# Patient Record
Sex: Female | Born: 1955 | Race: White | Hispanic: No | Smoking: Former smoker
Health system: Southern US, Community
[De-identification: ages and names within clinical notes are randomized; demographics above are authoritative.]

## PROBLEM LIST (undated history)

## (undated) DIAGNOSIS — F329 Major depressive disorder, single episode, unspecified: Secondary | ICD-10-CM

## (undated) DIAGNOSIS — H02839 Dermatochalasis of unspecified eye, unspecified eyelid: Secondary | ICD-10-CM

## (undated) DIAGNOSIS — T4145XA Adverse effect of unspecified anesthetic, initial encounter: Secondary | ICD-10-CM

## (undated) DIAGNOSIS — M1990 Unspecified osteoarthritis, unspecified site: Secondary | ICD-10-CM

## (undated) DIAGNOSIS — T8859XA Other complications of anesthesia, initial encounter: Secondary | ICD-10-CM

## (undated) DIAGNOSIS — F32A Depression, unspecified: Secondary | ICD-10-CM

## (undated) DIAGNOSIS — M7661 Achilles tendinitis, right leg: Secondary | ICD-10-CM

## (undated) DIAGNOSIS — M7580 Other shoulder lesions, unspecified shoulder: Secondary | ICD-10-CM

## (undated) DIAGNOSIS — E785 Hyperlipidemia, unspecified: Secondary | ICD-10-CM

## (undated) DIAGNOSIS — R51 Headache: Secondary | ICD-10-CM

## (undated) DIAGNOSIS — G4730 Sleep apnea, unspecified: Secondary | ICD-10-CM

## (undated) DIAGNOSIS — E119 Type 2 diabetes mellitus without complications: Secondary | ICD-10-CM

## (undated) DIAGNOSIS — F419 Anxiety disorder, unspecified: Secondary | ICD-10-CM

## (undated) HISTORY — PX: OTHER SURGICAL HISTORY: SHX169

## (undated) HISTORY — PX: LUNG BIOPSY: SHX232

## (undated) HISTORY — PX: BREAST BIOPSY: SHX20

## (undated) HISTORY — PX: ABLATION: SHX5711

## (undated) HISTORY — PX: BILATERAL TEMPOROMANDIBULAR JOINT ARTHROPLASTY: SUR77

## (undated) HISTORY — PX: CHOLECYSTECTOMY: SHX55

## (undated) HISTORY — PX: DILATION AND CURETTAGE OF UTERUS: SHX78

## (undated) HISTORY — DX: Dermatochalasis of unspecified eye, unspecified eyelid: H02.839

## (undated) HISTORY — PX: CARPAL TUNNEL RELEASE: SHX101

---

## 1998-03-20 ENCOUNTER — Emergency Department (HOSPITAL_COMMUNITY): Admission: EM | Admit: 1998-03-20 | Discharge: 1998-03-20 | Payer: Self-pay | Admitting: Emergency Medicine

## 1999-04-19 ENCOUNTER — Encounter: Admission: RE | Admit: 1999-04-19 | Discharge: 1999-04-19 | Payer: Self-pay | Admitting: Family Medicine

## 1999-04-19 ENCOUNTER — Encounter: Payer: Self-pay | Admitting: Family Medicine

## 1999-05-04 ENCOUNTER — Other Ambulatory Visit: Admission: RE | Admit: 1999-05-04 | Discharge: 1999-05-04 | Payer: Self-pay | Admitting: *Deleted

## 1999-05-11 ENCOUNTER — Ambulatory Visit (HOSPITAL_COMMUNITY): Admission: RE | Admit: 1999-05-11 | Discharge: 1999-05-11 | Payer: Self-pay | Admitting: Orthopedic Surgery

## 1999-05-11 ENCOUNTER — Encounter (INDEPENDENT_AMBULATORY_CARE_PROVIDER_SITE_OTHER): Payer: Self-pay

## 1999-11-11 ENCOUNTER — Emergency Department (HOSPITAL_COMMUNITY): Admission: EM | Admit: 1999-11-11 | Discharge: 1999-11-11 | Payer: Self-pay | Admitting: Emergency Medicine

## 2000-04-07 ENCOUNTER — Encounter: Admission: RE | Admit: 2000-04-07 | Discharge: 2000-04-07 | Payer: Self-pay | Admitting: Family Medicine

## 2000-04-07 ENCOUNTER — Encounter: Payer: Self-pay | Admitting: Family Medicine

## 2000-05-03 ENCOUNTER — Other Ambulatory Visit: Admission: RE | Admit: 2000-05-03 | Discharge: 2000-05-03 | Payer: Self-pay | Admitting: *Deleted

## 2001-04-09 ENCOUNTER — Encounter: Admission: RE | Admit: 2001-04-09 | Discharge: 2001-04-09 | Payer: Self-pay | Admitting: Family Medicine

## 2001-04-09 ENCOUNTER — Encounter: Payer: Self-pay | Admitting: Family Medicine

## 2001-04-20 ENCOUNTER — Other Ambulatory Visit: Admission: RE | Admit: 2001-04-20 | Discharge: 2001-04-20 | Payer: Self-pay | Admitting: *Deleted

## 2001-04-24 ENCOUNTER — Encounter: Admission: RE | Admit: 2001-04-24 | Discharge: 2001-04-24 | Payer: Self-pay | Admitting: Family Medicine

## 2001-04-24 ENCOUNTER — Encounter: Payer: Self-pay | Admitting: Family Medicine

## 2001-06-12 ENCOUNTER — Ambulatory Visit (HOSPITAL_COMMUNITY): Admission: RE | Admit: 2001-06-12 | Discharge: 2001-06-12 | Payer: Self-pay | Admitting: Orthopedic Surgery

## 2002-01-31 ENCOUNTER — Encounter: Payer: Self-pay | Admitting: Emergency Medicine

## 2002-01-31 ENCOUNTER — Observation Stay (HOSPITAL_COMMUNITY): Admission: EM | Admit: 2002-01-31 | Discharge: 2002-02-01 | Payer: Self-pay | Admitting: Emergency Medicine

## 2002-01-31 ENCOUNTER — Encounter: Payer: Self-pay | Admitting: Family Medicine

## 2002-04-10 ENCOUNTER — Encounter: Admission: RE | Admit: 2002-04-10 | Discharge: 2002-04-10 | Payer: Self-pay | Admitting: Family Medicine

## 2002-04-10 ENCOUNTER — Encounter: Payer: Self-pay | Admitting: Family Medicine

## 2002-04-22 ENCOUNTER — Ambulatory Visit (HOSPITAL_COMMUNITY): Admission: RE | Admit: 2002-04-22 | Discharge: 2002-04-22 | Payer: Self-pay | Admitting: Internal Medicine

## 2002-07-31 ENCOUNTER — Encounter: Admission: RE | Admit: 2002-07-31 | Discharge: 2002-07-31 | Payer: Self-pay | Admitting: Orthopedic Surgery

## 2002-07-31 ENCOUNTER — Encounter: Payer: Self-pay | Admitting: Orthopedic Surgery

## 2002-08-13 ENCOUNTER — Encounter: Payer: Self-pay | Admitting: Orthopedic Surgery

## 2002-08-13 ENCOUNTER — Encounter: Admission: RE | Admit: 2002-08-13 | Discharge: 2002-08-13 | Payer: Self-pay | Admitting: Orthopedic Surgery

## 2002-08-26 ENCOUNTER — Encounter: Admission: RE | Admit: 2002-08-26 | Discharge: 2002-08-26 | Payer: Self-pay | Admitting: Orthopedic Surgery

## 2002-08-26 ENCOUNTER — Encounter: Payer: Self-pay | Admitting: Orthopedic Surgery

## 2002-09-16 ENCOUNTER — Encounter: Payer: Self-pay | Admitting: *Deleted

## 2002-09-16 ENCOUNTER — Encounter: Admission: RE | Admit: 2002-09-16 | Discharge: 2002-09-16 | Payer: Self-pay | Admitting: *Deleted

## 2002-09-23 ENCOUNTER — Encounter: Payer: Self-pay | Admitting: *Deleted

## 2002-09-23 ENCOUNTER — Ambulatory Visit (HOSPITAL_COMMUNITY): Admission: RE | Admit: 2002-09-23 | Discharge: 2002-09-23 | Payer: Self-pay | Admitting: *Deleted

## 2003-01-16 ENCOUNTER — Ambulatory Visit (HOSPITAL_BASED_OUTPATIENT_CLINIC_OR_DEPARTMENT_OTHER): Admission: RE | Admit: 2003-01-16 | Discharge: 2003-01-16 | Payer: Self-pay | Admitting: Gynecology

## 2003-04-14 ENCOUNTER — Encounter: Payer: Self-pay | Admitting: Gynecology

## 2003-04-14 ENCOUNTER — Other Ambulatory Visit: Admission: RE | Admit: 2003-04-14 | Discharge: 2003-04-14 | Payer: Self-pay | Admitting: Gynecology

## 2003-04-14 ENCOUNTER — Encounter: Admission: RE | Admit: 2003-04-14 | Discharge: 2003-04-14 | Payer: Self-pay | Admitting: Gynecology

## 2003-06-17 ENCOUNTER — Encounter: Admission: RE | Admit: 2003-06-17 | Discharge: 2003-09-15 | Payer: Self-pay | Admitting: Family Medicine

## 2004-04-14 ENCOUNTER — Encounter: Admission: RE | Admit: 2004-04-14 | Discharge: 2004-04-14 | Payer: Self-pay | Admitting: Family Medicine

## 2004-05-06 ENCOUNTER — Other Ambulatory Visit: Admission: RE | Admit: 2004-05-06 | Discharge: 2004-05-06 | Payer: Self-pay | Admitting: *Deleted

## 2004-09-10 ENCOUNTER — Ambulatory Visit (HOSPITAL_COMMUNITY): Admission: RE | Admit: 2004-09-10 | Discharge: 2004-09-10 | Payer: Self-pay | Admitting: Orthopedic Surgery

## 2005-04-15 ENCOUNTER — Encounter: Admission: RE | Admit: 2005-04-15 | Discharge: 2005-04-15 | Payer: Self-pay | Admitting: Family Medicine

## 2005-05-10 ENCOUNTER — Other Ambulatory Visit: Admission: RE | Admit: 2005-05-10 | Discharge: 2005-05-10 | Payer: Self-pay | Admitting: *Deleted

## 2006-04-17 ENCOUNTER — Encounter: Admission: RE | Admit: 2006-04-17 | Discharge: 2006-04-17 | Payer: Self-pay | Admitting: Family Medicine

## 2006-05-16 ENCOUNTER — Other Ambulatory Visit: Admission: RE | Admit: 2006-05-16 | Discharge: 2006-05-16 | Payer: Self-pay | Admitting: *Deleted

## 2007-02-15 ENCOUNTER — Ambulatory Visit (HOSPITAL_COMMUNITY): Admission: RE | Admit: 2007-02-15 | Discharge: 2007-02-15 | Payer: Self-pay | Admitting: Orthopedic Surgery

## 2007-04-19 ENCOUNTER — Encounter: Admission: RE | Admit: 2007-04-19 | Discharge: 2007-04-19 | Payer: Self-pay | Admitting: Family Medicine

## 2007-05-06 ENCOUNTER — Emergency Department (HOSPITAL_COMMUNITY): Admission: EM | Admit: 2007-05-06 | Discharge: 2007-05-06 | Payer: Self-pay | Admitting: Emergency Medicine

## 2007-05-22 ENCOUNTER — Other Ambulatory Visit: Admission: RE | Admit: 2007-05-22 | Discharge: 2007-05-22 | Payer: Self-pay | Admitting: Family Medicine

## 2007-10-15 ENCOUNTER — Emergency Department (HOSPITAL_COMMUNITY): Admission: EM | Admit: 2007-10-15 | Discharge: 2007-10-15 | Payer: Self-pay | Admitting: Emergency Medicine

## 2008-04-21 ENCOUNTER — Encounter: Admission: RE | Admit: 2008-04-21 | Discharge: 2008-04-21 | Payer: Self-pay | Admitting: Family Medicine

## 2008-05-22 ENCOUNTER — Other Ambulatory Visit: Admission: RE | Admit: 2008-05-22 | Discharge: 2008-05-22 | Payer: Self-pay | Admitting: Family Medicine

## 2008-06-10 ENCOUNTER — Encounter (INDEPENDENT_AMBULATORY_CARE_PROVIDER_SITE_OTHER): Payer: Self-pay | Admitting: Gastroenterology

## 2008-06-10 ENCOUNTER — Ambulatory Visit (HOSPITAL_COMMUNITY): Admission: RE | Admit: 2008-06-10 | Discharge: 2008-06-10 | Payer: Self-pay | Admitting: Gastroenterology

## 2009-02-26 ENCOUNTER — Ambulatory Visit (HOSPITAL_COMMUNITY): Admission: RE | Admit: 2009-02-26 | Discharge: 2009-02-26 | Payer: Self-pay | Admitting: Family Medicine

## 2009-04-22 ENCOUNTER — Encounter: Admission: RE | Admit: 2009-04-22 | Discharge: 2009-04-22 | Payer: Self-pay | Admitting: Family Medicine

## 2009-05-25 ENCOUNTER — Other Ambulatory Visit: Admission: RE | Admit: 2009-05-25 | Discharge: 2009-05-25 | Payer: Self-pay | Admitting: Family Medicine

## 2009-11-24 ENCOUNTER — Emergency Department (HOSPITAL_COMMUNITY): Admission: EM | Admit: 2009-11-24 | Discharge: 2009-11-24 | Payer: Self-pay | Admitting: Family Medicine

## 2010-03-08 ENCOUNTER — Encounter
Admission: RE | Admit: 2010-03-08 | Discharge: 2010-06-06 | Payer: Self-pay | Source: Home / Self Care | Attending: Family Medicine | Admitting: Family Medicine

## 2010-03-11 ENCOUNTER — Emergency Department (HOSPITAL_COMMUNITY): Admission: EM | Admit: 2010-03-11 | Discharge: 2010-03-11 | Payer: Self-pay | Admitting: Emergency Medicine

## 2010-03-14 ENCOUNTER — Emergency Department (HOSPITAL_COMMUNITY): Admission: EM | Admit: 2010-03-14 | Discharge: 2010-03-14 | Payer: Self-pay | Admitting: Family Medicine

## 2010-04-23 ENCOUNTER — Encounter: Admission: RE | Admit: 2010-04-23 | Discharge: 2010-04-23 | Payer: Self-pay | Admitting: Family Medicine

## 2010-06-08 ENCOUNTER — Other Ambulatory Visit
Admission: RE | Admit: 2010-06-08 | Discharge: 2010-06-08 | Payer: Self-pay | Source: Home / Self Care | Admitting: Family Medicine

## 2010-07-12 ENCOUNTER — Encounter: Payer: Self-pay | Admitting: Family Medicine

## 2010-09-02 LAB — URINALYSIS, ROUTINE W REFLEX MICROSCOPIC
Bilirubin Urine: NEGATIVE
Glucose, UA: NEGATIVE mg/dL
Hgb urine dipstick: NEGATIVE
Ketones, ur: NEGATIVE mg/dL
Nitrite: NEGATIVE
Protein, ur: NEGATIVE mg/dL
Specific Gravity, Urine: 1.014 (ref 1.005–1.030)
Urobilinogen, UA: 0.2 mg/dL (ref 0.0–1.0)
pH: 8.5 — ABNORMAL HIGH (ref 5.0–8.0)

## 2010-09-02 LAB — BASIC METABOLIC PANEL
BUN: 13 mg/dL (ref 6–23)
CO2: 23 mEq/L (ref 19–32)
Calcium: 10.3 mg/dL (ref 8.4–10.5)
Chloride: 104 mEq/L (ref 96–112)
Creatinine, Ser: 0.81 mg/dL (ref 0.4–1.2)
GFR calc Af Amer: >60 mL/min (ref >60)
GFR calc non Af Amer: >60 mL/min (ref >60)
Glucose, Bld: 101 mg/dL — ABNORMAL HIGH (ref 70–99)
Potassium: 4 mEq/L (ref 3.5–5.1)
Sodium: 139 mEq/L (ref 135–145)

## 2010-09-02 LAB — POCT I-STAT, CHEM 8
BUN: 10 mg/dL (ref 6–23)
Calcium, Ion: 1.15 mmol/L (ref 1.12–1.32)
Chloride: 103 mEq/L (ref 96–112)
Creatinine, Ser: 0.8 mg/dL (ref 0.4–1.2)
Glucose, Bld: 91 mg/dL (ref 70–99)
HCT: 44 % (ref 36.0–46.0)
Hemoglobin: 15 g/dL (ref 12.0–15.0)
Potassium: 3.8 mEq/L (ref 3.5–5.1)
Sodium: 141 mEq/L (ref 135–145)
TCO2: 26 mmol/L (ref 0–100)

## 2010-09-02 LAB — POCT PREGNANCY, URINE: Preg Test, Ur: NEGATIVE

## 2010-09-02 LAB — GLUCOSE, CAPILLARY: Glucose-Capillary: 106 mg/dL — ABNORMAL HIGH (ref 70–99)

## 2010-11-02 NOTE — Op Note (Signed)
Taylor Barnes, SANBORN              ACCOUNT NO.:  [id number]   MEDICAL RECORD NO.:  [id number]          PATIENT TYPE:  AMB   LOCATION:  ENDO                         FACILITY:  [facility]   PHYSICIAN:  Bernette Redbird, M.D.   DATE OF BIRTH:  06-29-1955   DATE OF PROCEDURE:  06/10/2008  DATE OF DISCHARGE:                               OPERATIVE REPORT   PROCEDURE:  Colonoscopy with biopsy.   INDICATIONS:  A 55 year old female for initial colon cancer screening  exam.  She has a family history of colon polyps in her sister at age 27  whose polyps do need endoscopic followup and are therefore presumed to  be adenomatous in Editor, commissioning.   FINDINGS:  Diminutive polyp in the mid colon removed by cold biopsy.   PROCEDURE:  The purpose and risks of the procedure had been reviewed  with the patient who provided written consent.  Sedation was Phenergan  25 mg, fentanyl 75 mcg, and Versed 7.5 mg IV without arrhythmias or  desaturation.  The Pentax adult video colonoscope was advanced around  the colon to the terminal ileum which had a normal appearance and  pullback was then performed.  The appendiceal orifice was readily  identified.   The only abnormality on this exam was a 2-3 mm sessile polyp in the  transverse colon removed by 3 cold biopsies.  No other polyps were seen,  and there was no evidence of cancer, colitis, vascular ectasia, or  diverticulosis.  Retroflexion in the rectum and re-inspection of the  rectum were unremarkable.  The patient tolerated the procedure well, and  there were no apparent complications.   IMPRESSION:  Solitary diminutive polyp.  Family history of colon polyps.   PLAN:  Await pathology results with consideration for repeat colonoscopy  in 5 years regardless of the histologic findings on the current polyp,  in view of the family history.           ______________________________  Bernette Redbird, M.D.     RB/MEDQ  D:  06/10/2008  T:  06/10/2008  Job:   [id number]   cc:   Caryn Bee L. Little, M.D.

## 2010-11-05 NOTE — H&P (Signed)
NAME:  Taylor Barnes, Taylor Barnes                        ACCOUNT NO.:  [id number]   MEDICAL RECORD NO.:  [id number]                   PATIENT TYPE:  AMB   LOCATION:  [facility]                                 [facility]ACILITY:  [facility]   PHYSICIAN:  Timothy P. Fontaine, M.D.           DATE OF BIRTH:  [birth year]   DATE OF ADMISSION:  DATE OF DISCHARGE:                                HISTORY & PHYSICAL   CHIEF COMPLAINT:  Menorrhagia.   HISTORY OF PRESENT ILLNESS:  The patient is a 55 year old G2, P2 female,  status post tubal sterilization, who presents with worsening menorrhagia.  Outpatient evaluation shows ultrasound with some small leiomyomata and a  sonohistogram showing an empty normal-appearing endometrial cavity.  Endometrial biopsies showed proliferative endometrium.  TSH and prolactin  were normal.  I reviewed the various options with her as far as control of  her menorrhagia, to include hormonal manipulation, up to and including  hysterectomy.  The patient is not interested in hormonal manipulation,  apparently she has tried oral contraceptives in the past, and did not  tolerate the side effect profile.  The patient does want to proceed with HTA  endometrial ablation.   PAST MEDICAL HISTORY:  Hypercholesterolemia.   PAST SURGICAL HISTORY:  1. Cesarean section x2 with tubal sterilization.  2. Carpal tunnel.  3. Breast biopsy.  4. Jaw surgery.   ALLERGIES:  No known drug allergies.   CURRENT MEDICATIONS:  Paxil 20 mg daily.   REVIEW OF SYSTEMS:  Noncontributory.   SOCIAL HISTORY:  Noncontributory.   FAMILY HISTORY:  Noncontributory.   ADMISSION PHYSICAL EXAMINATION:  GENERAL:  Per anesthesia.  PELVIC:  External BUS and vagina normal.  Cervix grossly normal.  Uterus  grossly normal in size, limited exam due to abdominal girth.  Adnexa without  gross masses or tenderness, again limited by abdominal girth.   ASSESSMENT:  A 55 year old G2, P2 female, status post tubal sterilization,  history of menorrhagia.  Ultrasound shows small fibroids.  Ultrasound does  not show any submucous component.  She had an endometrial biopsy which shows  proliferative endometrium, normal prolactin and TSH.  She is admitted for  HTA ablation.  I have reviewed with the patient what is involved with HTA  ablation to include the expected intraoperative and postoperative courses.  She understands that pregnancy should never be considered following this  procedure, and that she more then likely would never be able to become  pregnant, but more importantly, would have a significant risk if indeed she  chose a pregnancy.  She does have a tubal sterilization, and pregnancy is  not an issue.  I also reviewed with her that there is no guarantee as far as  menorrhagia relief.  I reviewed the success rates with HTA with her, but she  understands that she may or may not receive adequate relief from the HTA.  I  discussed instrumentation with her, and the risks to include  the risk of  bleeding, transfusion, infection, uterine perforation, damage to internal  organs, including bowel, bladder, ureters, vessels, and nerves necessitating  major exploratory reparative surgeries and future reparative surgeries,  including ostomy port formation.  She understands that we are using hot  fluids and that there is a risk of thermal injury, both internal as well as  vaginal and vulvar perineal burns.  She understands and accepts these risks,  and wants to proceed with surgery.  The patient's questions are answered to  her satisfaction.  She is ready to proceed.                                               Timothy P. Audie Box, M.D.    TPF/MEDQ  D:  01/14/2003  T:  01/14/2003  Job:  [id number]

## 2010-11-05 NOTE — Consult Note (Signed)
NAME:  Taylor Barnes, Taylor Barnes                        ACCOUNT NO.:  [id number]   MEDICAL RECORD NO.:  [id number]                   PATIENT TYPE:  OBV   LOCATION:  0350                                 FACILITY:  [facility]   PHYSICIAN:  Charlaine Dalton. Sherene Sires, M.D. [facility]           DATE OF BIRTH:  1955-09-06   DATE OF CONSULTATION:  DATE OF DISCHARGE:  02/01/2002                          PULMONARY MEDICINE CONSULTATION   REASON FOR CONSULTATION:  Chest pain/abnormal chest x-ray.   HISTORY:  This is a 55 year old white female former smoker admitted with  left-sided chest pain.  She has actually had several episodes of chest pain  before typically in the sitting or standing positions but this was the  worst ever.  It started yesterday while sitting at a meeting and with a  pressure sensation that gradually worsened and was associated with mild  diaphoresis and nausea as well as feeling dizzy headed.  She did not  respond to nitroglycerin and has been ruled out by Dr. Nehemiah Settle.  I was asked  to see her because the chest x-ray was markedly abnormal as was CT scan  showing a large right paratracheal calcified mass and multiple mediastinal  hilar nodes.   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  She,  however, was never treated for histoplasmosis.  She denies any difficulty  swallowing, significant wheezing, chronic coughing, fever, chills or  unintended weight loss.   The patient denies any pleuritic component to her pain.  She denies any  history of exertional chest pain.  She does have history of irritable bowel  syndrome but is not taking anything for this.   PAST MEDICAL HISTORY:  1. Obesity.  2. Cholecystectomy.  3. Carpal tunnel surgery.  4. Cesarean section surgery.  5. Temporomandibular joint.   ALLERGIES:  None  known.   SOCIAL HISTORY:  She is a remote smoker.  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  She denies any unusual travel or hobby exposure.   FAMILY HISTORY:  Questionably positive for angina in her father.  Positive  for cancer in her mother which apparently metastasized from breast to bone.  She has several other members of her mother's family that have had cancers  but no lung cancer lymphoma to her knowledge.   REVIEW OF SYSTEMS:  Essentially negative except as noted above.   PHYSICAL EXAMINATION:  GENERAL:  This is an obese pleasant white female in  no acute distress.  She has normal vital signs.  HEENT:  Unremarkable.  Neck supple without cervical adenopathy, tenderness,  __________ or megaly.  LUNGS:  Lung fields are perfectly clear bilaterally to auscultation and  percussion.  Excellent air movement.  No wheezing.  HEART:  Regular rate  and rhythm without murmur, rub or gallop.  ABDOMEN:  Obese.  Otherwise benign.  EXTREMITIES:  Warm.  No cyanosis, clubbing or edema.   LABORATORY DATA:  Essentially normal.  CT scan was available but no  comparison films were available indicating a large calcified lymph node in  the paratracheal distribution as well as nonspecific adenopathy elsewhere.   Office records reviewed and indicated that I saw this patient actually in  1999 for a cough and documented the above history regarding the  histoplasmosis but did not obtain any records at that time.   IMPRESSION:  1. Recurrent pattern of fairly localized pain, which is in the left anterior     chest over the heart but most likely represents referred pain from     irritable bowel syndrome.  The fact is the pain has recurred     stereotypically without a pattern to suggest angina and has never     occurred in a supine position nor been reproduced by exertion.  I am     going to defer to Dr. Nehemiah Settle for the work-up of this but I do not believe     it is a pulmonary problem  and think this is most likely irritable bowel     syndrome.  I would recommend a trial of Citrucel 1 tsp. b.i.d. with     Levsin/SL p.r.n.  2. 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  The main risk     here is one of worsening adenitis with fibrosing mediastinitis causing     obstruction of either major airways, arteries or esophagus.  I do not     believe this is the case here but I would like to see this patient back     in two weeks so that we can do an apples to apples in comparison with     previous x-rays at that time.   FOLLOW UP:  I do not see any reason to continue to evaluate her any further  as an inpatient unless Dr. Nehemiah Settle is concerned about her cardiac status.  I  discussed this with him and the patient in detail today.                                                Charlaine Dalton. Sherene Sires, M.D. [facility]    MBW/MEDQ  D:  02/01/2002  T:  02/03/2002  Job:  [id number]   cc:   Raynelle Dick, M.D.

## 2010-11-05 NOTE — H&P (Signed)
NAME:  Taylor Barnes, Taylor Barnes                        ACCOUNT NO.:  [id number]   MEDICAL RECORD NO.:  [id number]                   PATIENT TYPE:  OBV   LOCATION:  0350                                 FACILITY:  [facility]   PHYSICIAN:  Raynelle Jan, MD                  DATE OF BIRTH:  [birth year]   DATE OF ADMISSION:  01/31/2002  DATE OF DISCHARGE:  02/01/2002                                HISTORY & PHYSICAL   PRIMARY PHYSICIAN:  Caryn Bee L. Little, M.D.   CHIEF COMPLAINT:  Chest pain.   HISTORY OF PRESENT ILLNESS:  This is a 55 year old female with a past  medical history of hypercholesterolemia who presents to the ED today with  chest pain that is substernal in nature.  She describes the pain as a  pressure sensation, 7/10 at its worse.  It is now 5/10, without relief  status post nitroglycerin.  It is described as a pressure-type sensation  without radiation.  She denies any shortness of breath.  She does note some  occasional diaphoresis, some nausea, but no vomiting.  She has had some calf  pains for over a year now which she attributed to Lipitor use.  No recent  increase in her calf pain.  She has had no recent surgeries or  immobilizations.  She has had similar chest pain symptoms for approximately  a month now.  She denies any association with food or activity.  The pain  was worse today, and occurred at rest prompting her to seek medical  attention.  She states that over the last three days she has had some  worsening pedal edema, but again, denies shortness of breath, orthopnea, or  PND.  She currently complains of a severe migraine headache, status post  nitroglycerin.   PAST MEDICAL HISTORY:  1. Hypercholesterolemia.  2. Obesity.  3. Premenstrual dysphoric disorder.  4. Migraines.   PAST SURGICAL HISTORY:  1. Cesarean section x2.  2. TMJ surgery.  3. 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  4. Carpal tunnel release.  5. Status  post cholecystectomy.   CURRENT MEDICATIONS:  1. Lipitor 10 mg p.o. q.d.  2. Paxil 20 mg p.o. q.d.  3. Migraine pill p.r.n.   ALLERGIES:  She has an intolerance to ASPIRIN which causes nausea.   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  Is separated with two  children.  She is a former smoker, and quit 10 years ago.  Denies any  alcohol use.   FAMILY HISTORY:  Her mother is deceased and died of breast cancer at the age  of 36.   REVIEW OF SYMPTOMS:  She has a stable weight.  Denies any recent fevers or  chills.  Denies any recent cough or URI symptoms.  She had a recent increase  in her headache  frequency over the last month, however, these are her  typical migraines, have not changed in pattern or intensity, and are  associated with no neurological deficits.  She notes no recent GI symptoms  such as heartburn, indigestion, nausea, vomiting, diarrhea, or constipation.  As noted in the HPI, she has had some pedal edema over the last three days.  The remainder of her review of systems is negative x10.   PHYSICAL EXAMINATION:  VITAL SIGNS:  Temperature 98.0, blood pressure  146/62, pulse 83 and regular, respirations 24, she is saturating 98% on room  air.  GENERAL:  This is a pleasant female in no acute distress.  She is alert and  oriented x4.  HEENT:  No scleral icterus.  Her pupils are equal.  Mucus membranes are  moist and pink.  NECK:  Supple without JVD or lymphadenopathy or bruits.  CARDIOVASCULAR:  She has a normal S1 and S2, regular rate and rhythm, no  murmurs, rubs, or gallops.  LUNGS:  Clear to auscultation bilaterally.  ABDOMEN:  Soft, obese, nontender, nondistended, positive bowel sounds, no  hepatosplenomegaly or masses.  RECTAL:  Heme positive, however, the patient is currently on her menses.  EXTREMITIES:  Notable for trace pedal edema.  Pedal pulses are 1+  bilaterally.   LABORATORY DATA:  Her cardiac enzymes are negative.  She has a slightly   elevated lipase at 54.  The remainder of her labs are pending.  Her EKG  shows a normal sinus rhythm, no acute changes.  Her chest x-ray shows  cardiomegaly with some large calcified nodes in the right paratracheal area,  otherwise negative.   ASSESSMENT AND PLAN:  This is a 55 year old female with chest pain symptoms,  concerning for angina with cardiac risk factors of hypercholesterolemia,  previous tobacco use, and obesity.  1. Chest pain.  We will admit for observation and monitoring.  We will given     aspirin, nitroglycerin p.r.n., morphine for her pain.  We will go ahead     and start beta blocker.  We will rule out with serial cardiac enzymes.     We will check a D-dimer.  If equivocal or positive, we will proceed with     a CT of the chest to rule out  pulmonary embolism.  We will continue her     Lipitor, and we will check a fasting lipid profile in the a.m.  Based on     initial workup, we will consider cardiology consultation, either     inpatient versus outpatient risk stratification.  2. Migraine headaches.  For now, we will use narcotics for pain control for     any pain that is not relieved by Tylenol.  3. Premenstrual dysphoric disorder.  We will continue her on her SSRI.                                               Raynelle Jan, MD    HMS/MEDQ  D:  01/31/2002  T:  02/02/2002  Job:  [id number]   cc:   Caryn Bee L. Little, M.D.

## 2010-11-05 NOTE — Op Note (Signed)
[facility]  Patient:    Taylor Barnes, Taylor Barnes Visit Number: [id number] MRN: [id number]          Service Type: Attending:  Elisha Ponder, M.D. Dictated by:   Elisha Ponder, M.D. Proc. Date: 06/12/01                             Operative Report  DATE OF BIRTH:  Sep 21, 1955.  PREOPERATIVE DIAGNOSIS:  A1 pulley tenosynovitis about the right ring finger refractory to conservative management.  POSTOPERATIVE DIAGNOSIS:  A1 pulley tenosynovitis about the right finger refractory to conservative management.  PROCEDURE: 1. Right ring finger A1 pulley release. 2. Right ring finger local flexor digitorum superficialis tenosynovectomy.  SURGEON:  Aron Baba, M.D.  ASSISTANT:  None.  COMPLICATIONS:  None.  ANESTHESIA:  Bier block.  TOURNIQUET TIME:  Less than 30 minutes.  ESTIMATED BLOOD LOSS:  Minimal.  INDICATIONS:  This patient is a 55 year old white female who presents with the above-mentioned diagnosis. The patient was counseled in regards to risks and benefits of surgery, including risk of infection, bleeding, anesthesia, damage to normal structures, and failure of surgery to accomplish its intended goals of relieving symptoms and restoring function. With this in mind, she desired to proceed. All questions have been encouraged and answered preoperatively.  OPERATIVE FINDINGS:  This patient had a thickened A1 pulley and associated cystic mass off of the A1 pulley which was removed. The patient underwent four A1 pulley release and a local tenosynovectomy about the FDS tendon. She tolerated the procedure well without difficulty. There were no complications.  PROCEDURE IN DETAIL:  The patient was seen by myself and anesthesia, was taken to the operative suite and underwent Bier block anesthesia under the direction of Dr. Lyda Perone Tarasidis. She was then prepped and draped in the usual sterile fashion about the right upper extremity with Betadine  scrub and paint. Following this, a midline incision was made over the A1 pulley taking care to avoid encroachment onto the distal palmar crease. The dissection was carried through the skin with knife blade and cautery was used for hemostasis. Blunt dissection was carried down to the A1 pulley which was identified. There was a large cyst overlying the A1 pulley. This was removed with a knife blade. Following this under 4-point zoom lens magnification, A1 pulley was released proximally and distally. I should note that preoperatively the patient was locking and I demonstrated this. Postrelease of the A1 pulley, the patient had no locking. She tolerated the release well. Following this FDS tenosynovectomy was performed without difficulty locally. The tenosynovectomy was accomplished without difficulty. Following tenosynovectomy of inflammatory tissue, the patient then had tourniquet deflated. Hemostasis was obtained with bipolar electrocautery and copious irrigation applied followed by closure of the wound with interrupted Prolene. The patient tolerated the procedure well. Sterile dressing was placed without difficulty. All sponge and instrument counts were reported as correct and there were no immediate intraoperative complications.  The patient will be seen in seven days for followup. She was given Vicodin, Robaxin for discharge medicines.  It has been a pleasure to participate in her care. All questions have been encouraged and answered. She was stable, awake, alert, and oriented in the recovery room. Dictated by:   Elisha Ponder, M.D. Attending:  Elisha Ponder, M.D. DD:  06/12/01 TD:  06/13/01 Job: [id number]

## 2010-11-05 NOTE — Op Note (Signed)
NAME:  Taylor Barnes, Taylor Barnes                        ACCOUNT NO.:  [id number]   MEDICAL RECORD NO.:  [id number]                   PATIENT TYPE:  AMB   LOCATION:  [facility]                                 FACILITY:  [facility]   PHYSICIAN:  Timothy P. Fontaine, M.D.           DATE OF BIRTH:  07-04-1955   DATE OF PROCEDURE:  01/16/2003  DATE OF DISCHARGE:                                 OPERATIVE REPORT   PREOPERATIVE DIAGNOSIS:  Menorrhagia.   POSTOPERATIVE DIAGNOSIS:  Menorrhagia.   PROCEDURE:  HTA endometrial ablation.   SURGEON:  Timothy P. Fontaine, M.D.   ANESTHESIA:  General.   ESTIMATED BLOOD LOSS:  Minimal.   COMPLICATIONS:  None.   SPECIMENS:  None.   INDICATIONS FOR PROCEDURE:  A 55 year old G2, P2 female status post tubal  sterilization with menorrhagia for HTA ablation. Immediately preoperatively,  I again reviewed the risks of the procedure with her to include the long-  term issues as to no guarantee for menorrhagia relief, the risks of  hematometra as well as the long-term issues of scars, endometrial cancer and  evaluation is concerned. The patient again acknowledges the risks, accepts  them and is ready to proceed with surgery.   FINDINGS:  Hysteroscopic evaluation is adequate noting fundus, anterior,  posterior uterine surfaces, lower uterine segment, endocervical canal, right  and left tubal ostia all visualized. 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   DESCRIPTION OF PROCEDURE:  The patient was taken to the operating room,  underwent general anesthesia and was placed in the dorsal lithotomy  position, received a perineal vaginal preparation with Betadine solution.  Bladder emptied with in and out Foley catheterization and EUA was performed.  The patient was then draped in the usual fashion. The cervix was visualized  with a surgical speculum, anterior lip grasped with a single tooth  tenaculum. The uterus initially sounded  and then gradually dilated to a #23  dilator. The HTA hysteroscope was then placed without difficulty with  findings noted above. After assuring proper placement of the hysteroscope in  the lower uterine segment, the HTA placement was accomplished without  difficulty and without any fluid leakage. At the end of the procedure,  reinspection of the cavity revealed a small strip of endometrium at the  fundus was still pink and the hysteroscope was then advanced and the fundal  region was treated for a three minute cycle. The fundal area then  showed appropriate blanching. The fluid cleared and cooled. Reinspection  showed nice even treatment and subsequently at the end of the cycle, the  hysteroscope was removed, tenaculum removed, adequate hemostasis visualized.  The patient placed in supine position, awakened and taken to the recovery  room in good condition.  Timothy P. Audie Box, M.D.    TPF/MEDQ  D:  01/16/2003  T:  01/16/2003  Job:  [id number]

## 2010-11-05 NOTE — Op Note (Signed)
Taylor Barnes, Taylor Barnes              ACCOUNT NO.:  [id number]   MEDICAL RECORD NO.:  [id number]          PATIENT TYPE:  AMB   LOCATION:  DAY                          FACILITY:  [facility]   PHYSICIAN:  Dionne Ano. Gramig III, M.D.DATE OF BIRTH:  1955-11-01   DATE OF PROCEDURE:  DATE OF DISCHARGE:                                 OPERATIVE REPORT   PREOPERATIVE DIAGNOSES:  1.  Left ring finger, middle finger, A1 pulley tenosynovitis with very      conservative management.  2.  Left small finger early A1 pulley tenosynovitis.   POSTOPERATIVE DIAGNOSES:  1.  Left ring finger, middle finger, A1 pulley tenosynovitis with very      conservative management.  2.  Left small finger early A1 pulley tenosynovitis.   PROCEDURE:  1.  Left ring finger A1 pulley release.  2.  Left middle finger A1 pulley release.  3.  Left small finger injection with Kenalog and lidocaine.  4.  Left ring finger tenosynovectomy about the flexor digitorum profundus      and flexor digitorum superficialis tendons.   SURGEON:  Dionne Ano. Amanda Pea, M.D.   COMPLICATIONS:  None.   ANESTHESIA:  MAC with intermetacarpal block.   SPECIMENS:  None.   ESTIMATED BLOOD LOSS:  Minimal.   INDICATIONS FOR PROCEDURE:  Patient is a very pleasant 55 year old female  who presents __________ risks and benefits of surgery.  She has decided to  proceed with the above-mentioned operative intervention.   OPERATIVE FINDINGS:  Patient has thickened A1 pulleys about the ring and  middle fingers.  She underwent release without difficulty and local  tenosynovectomy about the ring finger.  I concomitantly performed an  injection over the small finger A1 pulley.   OPERATION IN DETAIL:  Patient was administered MAC anesthesia and taken to  the operative suite.  Underwent induction of MAC anesthesia.  Was given an  intermetacarpal block with Marcaine and lidocaine without epinephrine  mixture.  This was 0.25% Marcaine and 1% lidocaine without  epinephrine.  Once this was done, she was prepped and draped a second time with Betadine  scrub and paint.  __________ 250 mmHg.  Once this was done, a longitudinal  incision was made over the ring finger.  Dissection was carried down.  Neurovascular bundles were protected.  The A1 pulley was released under  __________ application without difficulty.  Following this, a local  tenosynovectomy was performed.  Traction check was applied.  All looked  quite well.  I was pleased with this.  The patient then underwent left  middle finger A1 pulley release.  There was no significant tenosynovitis or  excessive inflammation that had to be removed.  I performed traction check,  and all looked well.  Neurovascular structures were protected.  Following  this, the tourniquet was inflated.  Irrigation was obtained/performed.  The  patient tolerated this well.  Following this, the patient had an injection  of Kenalog and lidocaine mixture placed about the small finger A1 pulley  region.  This is done to my satisfaction without difficulty.  There were no  complicated features.  Once this was done, I then irrigated copiously  additionally and closed the wound with standard 2-0 Prolene.  Sterile  dressing was applied.  She had excellent refill in the soft compartments.  No complicating features.  She was taken to the recovery room in stable  condition.  All sponge, needle, and instrument counts were reported as  correct.  Patient will be monitored in the recovery room and discharged home  on Vicodin, Robaxin.  See [place] in one week.  Notify if significant problems  occur.      WMG/MEDQ  D:  09/10/2004  T:  09/10/2004  Job:  [id number]

## 2011-03-15 LAB — LIPASE, BLOOD: Lipase: 50

## 2011-03-15 LAB — HEPATIC FUNCTION PANEL
ALT: 25
AST: 27
Albumin: 3.4 — ABNORMAL LOW
Alkaline Phosphatase: 67
Bilirubin, Direct: 0.2
Indirect Bilirubin: 0.4
Total Bilirubin: 0.6
Total Protein: 6.6

## 2011-03-15 LAB — BASIC METABOLIC PANEL
BUN: 15
CO2: 21
Calcium: 9.1
Chloride: 105
Creatinine, Ser: 0.76
GFR calc Af Amer: >60
GFR calc non Af Amer: >60
Glucose, Bld: 110 — ABNORMAL HIGH
Potassium: 3.9
Sodium: 137

## 2011-03-15 LAB — CBC
HCT: 38.5
Hemoglobin: 13.5
MCHC: 35.1
MCV: 84.2
Platelets: 257
RBC: 4.57
RDW: 13.8
WBC: 9.7

## 2011-03-15 LAB — DIFFERENTIAL
Basophils Absolute: 0
Basophils Relative: 1
Eosinophils Absolute: 0.2
Eosinophils Relative: 2
Lymphocytes Relative: 19
Lymphs Abs: 1.9
Monocytes Absolute: 0.5
Monocytes Relative: 5
Neutro Abs: 7.1
Neutrophils Relative %: 73

## 2011-03-25 ENCOUNTER — Other Ambulatory Visit: Payer: Self-pay | Admitting: Family Medicine

## 2011-03-25 DIAGNOSIS — Z1231 Encounter for screening mammogram for malignant neoplasm of breast: Secondary | ICD-10-CM

## 2011-03-25 LAB — GLUCOSE, CAPILLARY: Glucose-Capillary: 112 mg/dL — ABNORMAL HIGH (ref 70–99)

## 2011-04-29 ENCOUNTER — Ambulatory Visit
Admission: RE | Admit: 2011-04-29 | Discharge: 2011-04-29 | Disposition: A | Discharge status: Home / Self Care | Payer: Commercial Managed Care - PPO | Source: Ambulatory Visit | Attending: Family Medicine | Admitting: Family Medicine

## 2011-04-29 DIAGNOSIS — Z1231 Encounter for screening mammogram for malignant neoplasm of breast: Secondary | ICD-10-CM

## 2011-06-22 ENCOUNTER — Other Ambulatory Visit: Payer: Self-pay | Admitting: Family Medicine

## 2011-06-22 ENCOUNTER — Other Ambulatory Visit (HOSPITAL_COMMUNITY)
Admission: RE | Admit: 2011-06-22 | Discharge: 2011-06-22 | Disposition: A | Discharge status: Home / Self Care | Payer: 59 | Source: Ambulatory Visit | Attending: Family Medicine | Admitting: Family Medicine

## 2011-06-22 DIAGNOSIS — Z124 Encounter for screening for malignant neoplasm of cervix: Secondary | ICD-10-CM | POA: Insufficient documentation

## 2011-06-22 DIAGNOSIS — Z1159 Encounter for screening for other viral diseases: Secondary | ICD-10-CM | POA: Insufficient documentation

## 2011-10-14 ENCOUNTER — Other Ambulatory Visit (HOSPITAL_COMMUNITY): Payer: Self-pay | Admitting: Family Medicine

## 2011-10-14 ENCOUNTER — Ambulatory Visit (HOSPITAL_COMMUNITY)
Admission: RE | Admit: 2011-10-14 | Discharge: 2011-10-14 | Disposition: A | Discharge status: Home / Self Care | Payer: 59 | Source: Ambulatory Visit | Attending: Family Medicine | Admitting: Family Medicine

## 2011-10-14 DIAGNOSIS — K7689 Other specified diseases of liver: Secondary | ICD-10-CM | POA: Insufficient documentation

## 2011-10-14 DIAGNOSIS — R7989 Other specified abnormal findings of blood chemistry: Secondary | ICD-10-CM

## 2011-10-14 DIAGNOSIS — R1013 Epigastric pain: Secondary | ICD-10-CM

## 2011-10-14 DIAGNOSIS — Z9089 Acquired absence of other organs: Secondary | ICD-10-CM | POA: Insufficient documentation

## 2011-10-14 DIAGNOSIS — D1803 Hemangioma of intra-abdominal structures: Secondary | ICD-10-CM | POA: Insufficient documentation

## 2011-10-14 DIAGNOSIS — D3000 Benign neoplasm of unspecified kidney: Secondary | ICD-10-CM | POA: Insufficient documentation

## 2011-10-14 DIAGNOSIS — K449 Diaphragmatic hernia without obstruction or gangrene: Secondary | ICD-10-CM | POA: Insufficient documentation

## 2011-10-14 LAB — POCT I-STAT, CHEM 8
BUN: 28 mg/dL — ABNORMAL HIGH (ref 6–23)
Calcium, Ion: 1.19 mmol/L (ref 1.12–1.32)
Chloride: 107 mEq/L (ref 96–112)
Creatinine, Ser: 0.8 mg/dL (ref 0.50–1.10)
Glucose, Bld: 96 mg/dL (ref 70–99)
HCT: 37 % (ref 36.0–46.0)
Hemoglobin: 12.6 g/dL (ref 12.0–15.0)
Potassium: 4.6 mEq/L (ref 3.5–5.1)
Sodium: 136 mEq/L (ref 135–145)
TCO2: 24 mmol/L (ref 0–100)

## 2011-10-14 MED ORDER — IOHEXOL 300 MG/ML  SOLN
100.0000 mL | Freq: Once | INTRAMUSCULAR | Status: AC | PRN
  Administered 2011-10-14: 100 mL via INTRAVENOUS

## 2011-11-21 ENCOUNTER — Other Ambulatory Visit (HOSPITAL_COMMUNITY): Payer: Self-pay | Admitting: Orthopedic Surgery

## 2011-11-21 DIAGNOSIS — M67919 Unspecified disorder of synovium and tendon, unspecified shoulder: Secondary | ICD-10-CM

## 2011-11-21 DIAGNOSIS — M719 Bursopathy, unspecified: Secondary | ICD-10-CM

## 2011-11-23 ENCOUNTER — Inpatient Hospital Stay (HOSPITAL_COMMUNITY): Admission: RE | Admit: 2011-11-23 | Payer: 59 | Source: Ambulatory Visit

## 2012-04-09 ENCOUNTER — Other Ambulatory Visit: Payer: Self-pay | Admitting: Family Medicine

## 2012-04-09 DIAGNOSIS — Z1231 Encounter for screening mammogram for malignant neoplasm of breast: Secondary | ICD-10-CM

## 2012-05-11 ENCOUNTER — Ambulatory Visit
Admission: RE | Admit: 2012-05-11 | Discharge: 2012-05-11 | Disposition: A | Discharge status: Home / Self Care | Payer: 59 | Source: Ambulatory Visit | Attending: Family Medicine | Admitting: Family Medicine

## 2012-05-11 DIAGNOSIS — Z1231 Encounter for screening mammogram for malignant neoplasm of breast: Secondary | ICD-10-CM

## 2012-06-06 ENCOUNTER — Ambulatory Visit (INDEPENDENT_AMBULATORY_CARE_PROVIDER_SITE_OTHER): Payer: 59 | Admitting: Family Medicine

## 2012-06-06 DIAGNOSIS — Z131 Encounter for screening for diabetes mellitus: Secondary | ICD-10-CM

## 2012-06-07 NOTE — Progress Notes (Signed)
Patient presents today for 3 month DM follow up as part of the employee sponsored Link to [employer]. Medications have been reviewed. I have also discussed with patient lifestyle interventions such as diet and exercise. Details of this visit can be found in [facility] documenting program through [facility]). Patient has set a series of personal goals and will follow up in 3 months for further review of DM.

## 2012-06-11 NOTE — Progress Notes (Signed)
Patient ID: Taylor Barnes, female   DOB: [birth year], 57 y.o.   MRN: [id number] Reviewed: Agree with documentation and management.

## 2012-08-04 ENCOUNTER — Other Ambulatory Visit (HOSPITAL_COMMUNITY): Payer: Self-pay | Admitting: Orthopedic Surgery

## 2012-08-04 DIAGNOSIS — M75100 Unspecified rotator cuff tear or rupture of unspecified shoulder, not specified as traumatic: Secondary | ICD-10-CM

## 2012-08-06 ENCOUNTER — Ambulatory Visit (HOSPITAL_COMMUNITY)
Admission: RE | Admit: 2012-08-06 | Discharge: 2012-08-06 | Disposition: A | Discharge status: Home / Self Care | Payer: 59 | Source: Ambulatory Visit | Attending: Orthopedic Surgery | Admitting: Orthopedic Surgery

## 2012-08-06 DIAGNOSIS — S43429A Sprain of unspecified rotator cuff capsule, initial encounter: Secondary | ICD-10-CM | POA: Insufficient documentation

## 2012-08-06 DIAGNOSIS — M25519 Pain in unspecified shoulder: Secondary | ICD-10-CM | POA: Insufficient documentation

## 2012-08-06 DIAGNOSIS — M75100 Unspecified rotator cuff tear or rupture of unspecified shoulder, not specified as traumatic: Secondary | ICD-10-CM

## 2012-08-06 DIAGNOSIS — X58XXXA Exposure to other specified factors, initial encounter: Secondary | ICD-10-CM | POA: Insufficient documentation

## 2012-09-04 ENCOUNTER — Ambulatory Visit (INDEPENDENT_AMBULATORY_CARE_PROVIDER_SITE_OTHER): Payer: Self-pay | Admitting: Family Medicine

## 2012-09-04 DIAGNOSIS — E119 Type 2 diabetes mellitus without complications: Secondary | ICD-10-CM

## 2012-09-04 NOTE — Progress Notes (Signed)
Patient presents to pharmacy for 3 month follow of DM as part of the employee sponsored Link to [employer]. Medications have been reviewed. I have also discussed with patient lifestyle interventions such as diet and exercise. Full documentation of this visit can be found in the [employer] documenting system through [employer] [facility]). Patient has set a series of personal goals and will return in 3 months for further review of DM.

## 2012-09-18 NOTE — Progress Notes (Signed)
Patient ID: Taylor Barnes, female   DOB: Aug 28, 1955, 57 y.o.   MRN: [id number] ATTENDING PHYSICIAN NOTE: I have reviewed the chart and agree with the plan as detailed above. Denny Levy MD Pager [phone]

## 2012-12-11 ENCOUNTER — Ambulatory Visit (INDEPENDENT_AMBULATORY_CARE_PROVIDER_SITE_OTHER): Payer: 59 | Admitting: Family Medicine

## 2012-12-11 DIAGNOSIS — E119 Type 2 diabetes mellitus without complications: Secondary | ICD-10-CM

## 2012-12-11 NOTE — Progress Notes (Signed)
Patient presents for 3 month follow DM as part of the employee sponsored Link to [employer]. Medications have been reviewed. I have also discussed with patient lifestyle interventions such as diet and exercise. Full documentation of this visit can be found in the [employer] documenting system through [employer] [facility]). Patient has set a series of personal goals and will follow up in 3 months for further review of DM.

## 2013-01-08 NOTE — Progress Notes (Signed)
Patient ID: Taylor Barnes, female   DOB: 11/28/1955, 57 y.o.   MRN: [id number] ATTENDING PHYSICIAN NOTE: I have reviewed the chart and agree with the plan as detailed above. Marletta Bousquet MD Pager [phone]  

## 2013-04-04 ENCOUNTER — Ambulatory Visit (INDEPENDENT_AMBULATORY_CARE_PROVIDER_SITE_OTHER): Payer: 59 | Admitting: Family Medicine

## 2013-04-04 VITALS — BP 114/72 | HR 83

## 2013-04-04 DIAGNOSIS — E119 Type 2 diabetes mellitus without complications: Secondary | ICD-10-CM

## 2013-04-04 NOTE — Progress Notes (Signed)
Patient presents for 3 mo f/u DM as part of the employee sponsored Link to [employer]. Medications have been reviewed. I have also discussed with patient lifestyle interventions such as diet and exercise. Full documentation of this visit can be found in the [employer] documenting system through [employer] [facility]). However specifics of this visit include the following:  Plan 1.) POC A1C 5.9, at goal on metformin, no recommended changes 2.) goal wt loss by next 5 lb 3.) log 50 workouts onto [email or web address] for $50 4.) Ask MD at next visit if she has received pneumovax before  Patient has set a series of personal goals and will f/u in 3 mo for further review of DM

## 2013-04-19 ENCOUNTER — Other Ambulatory Visit: Payer: Self-pay

## 2013-04-19 DIAGNOSIS — Z1231 Encounter for screening mammogram for malignant neoplasm of breast: Secondary | ICD-10-CM

## 2013-05-21 ENCOUNTER — Ambulatory Visit
Admission: RE | Admit: 2013-05-21 | Discharge: 2013-05-21 | Disposition: A | Discharge status: Home / Self Care | Payer: 59 | Source: Ambulatory Visit

## 2013-05-21 DIAGNOSIS — Z1231 Encounter for screening mammogram for malignant neoplasm of breast: Secondary | ICD-10-CM

## 2013-06-11 NOTE — Progress Notes (Signed)
Patient ID: Taylor Barnes, female   DOB: 05/16/1956, 57 y.o.   MRN: [id number] ATTENDING PHYSICIAN NOTE: I have reviewed the chart and agree with the plan as detailed above. Sara Neal MD Pager [phone]  

## 2013-07-15 ENCOUNTER — Ambulatory Visit (INDEPENDENT_AMBULATORY_CARE_PROVIDER_SITE_OTHER): Payer: Self-pay | Admitting: Family Medicine

## 2013-07-15 VITALS — BP 116/73 | HR 79 | Wt 229.0 lb

## 2013-07-15 DIAGNOSIS — E119 Type 2 diabetes mellitus without complications: Secondary | ICD-10-CM

## 2013-07-15 NOTE — Progress Notes (Signed)
Patient presents for 3 mo f/u DM as part of the employee sponsored Link to [employer]. Medications have been reviewed. I have also discussed with patient lifestyle interventions such as diet and exercise. Full documentation of this visit can be found in the [employer] documenting system through [facility]). However specifics from this visit include the following:  Diabetes Mellitus: POC A1C 5.5, at goal, no recommended changes. Patient will work on exercising more. f/u 3 mo  Hyperlipidemia: on appropriately dosed statin  Hypertension: at goal on lisinopril   Patient has set a series of personal goals and will f/u in 3 mo for further review of DM.

## 2013-08-09 NOTE — Progress Notes (Signed)
Patient ID: Taylor Barnes, female   DOB: [birth year], 58 y.o.   MRN: [id number] ATTENDING PHYSICIAN NOTE: I have reviewed the chart and agree with the plan as detailed above. Dorcas Mcmurray MD Pager [phone]

## 2013-10-17 ENCOUNTER — Ambulatory Visit (INDEPENDENT_AMBULATORY_CARE_PROVIDER_SITE_OTHER): Payer: Self-pay | Admitting: Family Medicine

## 2013-10-17 VITALS — BP 116/70 | HR 78 | Wt 226.0 lb

## 2013-10-17 DIAGNOSIS — E119 Type 2 diabetes mellitus without complications: Secondary | ICD-10-CM

## 2013-10-17 NOTE — Progress Notes (Signed)
Patient presents for 3 mo f/u DM as part of the employee sponsored [employer]. Medications have been reviewed. I have also discussed with patient lifestyle interventions such as diet and exercise. Full documentation of this visit can be found in the [employer]tracker documenting system through [facility]). However specifics from this visit include the following:  Diabetes Mellitus:POC A1C 5.8, at goal on metformin. No recommended changes. will move patient to 6 mo f/u as she has been stable for a long time. Discussed diet and exercise improvement. Patient going through very stressful time at work and recently lost her brother so diet has been worse.  Hypertension: at goal on lisinopril  Patient has set a series of personal goals and will f/u in 6 mo for further review of DM

## 2013-11-15 ENCOUNTER — Encounter (HOSPITAL_COMMUNITY): Payer: Self-pay | Admitting: Pharmacy Technician

## 2013-11-19 ENCOUNTER — Encounter (HOSPITAL_COMMUNITY): Payer: Self-pay | Admitting: Pharmacy Technician

## 2013-11-25 ENCOUNTER — Encounter (HOSPITAL_COMMUNITY): Payer: Self-pay | Admitting: *Deleted

## 2013-12-06 ENCOUNTER — Encounter (HOSPITAL_COMMUNITY)
Admission: RE | Disposition: A | Discharge status: Home / Self Care | Payer: Self-pay | Source: Ambulatory Visit | Attending: Gastroenterology

## 2013-12-06 ENCOUNTER — Ambulatory Visit (HOSPITAL_COMMUNITY): Payer: 59 | Admitting: Anesthesiology

## 2013-12-06 ENCOUNTER — Encounter (HOSPITAL_COMMUNITY): Payer: 59 | Admitting: Anesthesiology

## 2013-12-06 ENCOUNTER — Ambulatory Visit (HOSPITAL_COMMUNITY)
Admission: RE | Admit: 2013-12-06 | Discharge: 2013-12-06 | Disposition: A | Discharge status: Home / Self Care | Payer: 59 | Source: Ambulatory Visit | Attending: Gastroenterology | Admitting: Gastroenterology

## 2013-12-06 ENCOUNTER — Encounter (HOSPITAL_COMMUNITY): Payer: Self-pay | Admitting: Anesthesiology

## 2013-12-06 DIAGNOSIS — Z860100 Personal history of colon polyps, unspecified: Secondary | ICD-10-CM | POA: Insufficient documentation

## 2013-12-06 DIAGNOSIS — I10 Essential (primary) hypertension: Secondary | ICD-10-CM | POA: Insufficient documentation

## 2013-12-06 DIAGNOSIS — Z1211 Encounter for screening for malignant neoplasm of colon: Secondary | ICD-10-CM | POA: Insufficient documentation

## 2013-12-06 DIAGNOSIS — E119 Type 2 diabetes mellitus without complications: Secondary | ICD-10-CM | POA: Insufficient documentation

## 2013-12-06 DIAGNOSIS — Z87891 Personal history of nicotine dependence: Secondary | ICD-10-CM | POA: Insufficient documentation

## 2013-12-06 DIAGNOSIS — Z6839 Body mass index (BMI) 39.0-39.9, adult: Secondary | ICD-10-CM | POA: Insufficient documentation

## 2013-12-06 DIAGNOSIS — M19019 Primary osteoarthritis, unspecified shoulder: Secondary | ICD-10-CM | POA: Insufficient documentation

## 2013-12-06 DIAGNOSIS — Z79899 Other long term (current) drug therapy: Secondary | ICD-10-CM | POA: Insufficient documentation

## 2013-12-06 HISTORY — DX: Other complications of anesthesia, initial encounter: T88.59XA

## 2013-12-06 HISTORY — DX: Unspecified osteoarthritis, unspecified site: M19.90

## 2013-12-06 HISTORY — DX: Headache: R51

## 2013-12-06 HISTORY — DX: Type 2 diabetes mellitus without complications: E11.9

## 2013-12-06 HISTORY — PX: COLONOSCOPY: SHX5424

## 2013-12-06 HISTORY — DX: Adverse effect of unspecified anesthetic, initial encounter: T41.45XA

## 2013-12-06 HISTORY — DX: Other shoulder lesions, unspecified shoulder: M75.80

## 2013-12-06 LAB — GLUCOSE, CAPILLARY: Glucose-Capillary: 109 mg/dL — ABNORMAL HIGH (ref 70–99)

## 2013-12-06 SURGERY — COLONOSCOPY
Anesthesia: Monitor Anesthesia Care

## 2013-12-06 MED ORDER — SODIUM CHLORIDE 0.9 % IV SOLN: INTRAVENOUS | Status: DC

## 2013-12-06 MED ORDER — LACTATED RINGERS IV SOLN
INTRAVENOUS | Status: DC | PRN
  Administered 2013-12-06: 07:00:00 via INTRAVENOUS
  Administered 2013-12-06: 1000 mL

## 2013-12-06 MED ORDER — PROPOFOL 10 MG/ML IV BOLUS
INTRAVENOUS | Status: AC
  Filled 2013-12-06: qty 20

## 2013-12-06 MED ORDER — PROPOFOL 10 MG/ML IV BOLUS
INTRAVENOUS | Status: DC | PRN
  Administered 2013-12-06 (×2): 25 mg via INTRAVENOUS
  Administered 2013-12-06: 50 mg via INTRAVENOUS
  Administered 2013-12-06: 25 mg via INTRAVENOUS
  Administered 2013-12-06: 50 mg via INTRAVENOUS
  Administered 2013-12-06: 25 mg via INTRAVENOUS
  Administered 2013-12-06 (×3): 50 mg via INTRAVENOUS
  Administered 2013-12-06: 25 mg via INTRAVENOUS

## 2013-12-06 NOTE — Anesthesia Postprocedure Evaluation (Signed)
Anesthesia Post Note  Patient: Taylor Barnes  Procedure(s) Performed: Procedure(s) (LRB): COLONOSCOPY (N/A)  Anesthesia type: MAC  Patient location: PACU  Post pain: Pain level controlled  Post assessment: Post-op Vital signs reviewed  Last Vitals: BP 122/66  Pulse 53  Temp(Src) 36.9 C  Resp 19  Ht 5\' 4"  (1.626 m)  Wt 228 lb (103.42 kg)  BMI 39.12 kg/m2  SpO2 98%  Post vital signs: Reviewed  Level of consciousness: awake  Complications: No apparent anesthesia complications

## 2013-12-06 NOTE — Transfer of Care (Signed)
Immediate Anesthesia Transfer of Care Note  Patient: Taylor Barnes  Procedure(s) Performed: Procedure(s): COLONOSCOPY (N/A)  Patient Location: PACU  Anesthesia Type:MAC  Level of Consciousness: awake, sedated and patient cooperative  Airway & Oxygen Therapy: Patient Spontanous Breathing and Patient connected to face mask oxygen  Post-op Assessment: Report given to PACU RN and Post -op Vital signs reviewed and stable  Post vital signs: Reviewed and stable  Complications: No apparent anesthesia complications

## 2013-12-06 NOTE — H&P (Signed)
Taylor Barnes is an 58 y.o. female.   Chief Complaint: History of colon polyps HPI: Without current active GI problems or symptoms comes in to have surveillance colonoscopy. She had a solitary diminutive adenomatous polyp removed approximately 5-1/2 years ago.  Past Medical History  Diagnosis Date  . Headache(784.0)     hx of migraines  . Arthritis     both shoulders  . AC (acromioclavicular) joint bone spurs     both feet  . Complication of anesthesia     trouble waking up  . Diabetes mellitus without complication     takes lisinopril to protect kidneys    Past Surgical History  Procedure Laterality Date  . Lung biopsy  age 89  . Bilateral temporomandibular joint arthroplasty  age 36  . Breast biopsy Right yrs ago  . Carpal tunnel release Bilateral     multiple times both hands  .   c sections      x 2  . Cholecystectomy  yrs ago  . Dilation and curettage of uterus  yrs ago  . Cyst removed from hand  yrs ago  . Ablation      uteres    History reviewed. No pertinent family history. Social History:  reports that she quit smoking about 28 years ago. She has never used smokeless tobacco. She reports that she does not drink alcohol or use illicit drugs.  Allergies:  Allergies  Allergen Reactions  . Aspirin Nausea And Vomiting    Medications Prior to Admission  Medication Sig Dispense Refill  . atorvastatin (LIPITOR) 40 MG tablet Take 40 mg by mouth every morning.       Marland Kitchen lisinopril (PRINIVIL,ZESTRIL) 5 MG tablet Take 5 mg by mouth every morning.       . meloxicam (MOBIC) 15 MG tablet Take 15 mg by mouth daily.      . sertraline (ZOLOFT) 100 MG tablet Take 150 mg by mouth every morning. Take 1.5 tablets by mouth daily      . metFORMIN (GLUCOPHAGE) 500 MG tablet Take 500-1,000 mg by mouth 2 (two) times daily with a meal. 1 tablet in the morning and 2 in the afternoon        Results for orders placed during the hospital encounter of 12/06/13 (from the past 48 hour(s))   GLUCOSE, CAPILLARY     Status: Abnormal   Collection Time    12/06/13  7:26 AM      Result Value Ref Range   Glucose-Capillary 109 (*) 70 - 99 mg/dL   No results found.  ROS  No active GI complaints  Blood pressure 115/68, pulse 60, temperature 98.4 F (36.9 C), resp. rate 14, height 5\' 4"  (1.626 m), weight 103.42 kg (228 lb), SpO2 95.00%. Physical Exam a significantly overweight but otherwise healthy-appearing Caucasian female in no distress. Vital signs unremarkable. Chest clear. Heart is without murmurs, gallops, or arrhythmias. Abdomen significantly obese, but nontender. No evident pallor or icterus.  Assessment/Plan History of solitary diminutive adenomatous polyp. For surveillance. Plan is colonoscopy under propofol sedation.  Howard Bunte V 12/06/2013, 7:37 AM

## 2013-12-06 NOTE — Discharge Instructions (Signed)
We anticipate doing a repeat surveillance colonoscopy in 10 years.Colonoscopy, Care After Refer to this sheet in the next few weeks. These instructions provide you with information on caring for yourself after your procedure. Your health care provider may also give you more specific instructions. Your treatment has been planned according to current medical practices, but problems sometimes occur. Call your health care provider if you have any problems or questions after your procedure. WHAT TO EXPECT AFTER THE PROCEDURE  After your procedure, it is typical to have the following:  A small amount of blood in your stool.  Moderate amounts of gas and mild abdominal cramping or bloating. HOME CARE INSTRUCTIONS  Do not drive, operate machinery, or sign important documents for 24 hours.  You may shower and resume your regular physical activities, but move at a slower pace for the first 24 hours.  Take frequent rest periods for the first 24 hours.  Walk around or put a warm pack on your abdomen to help reduce abdominal cramping and bloating.  Drink enough fluids to keep your urine clear or pale yellow.  You may resume your normal diet as instructed by your health care provider. Avoid heavy or fried foods that are hard to digest.  Avoid drinking alcohol for 24 hours or as instructed by your health care provider.  Only take over-the-counter or prescription medicines as directed by your health care provider.  If a tissue sample (biopsy) was taken during your procedure:  Do not take aspirin or blood thinners for 7 days, or as instructed by your health care provider.  Do not drink alcohol for 7 days, or as instructed by your health care provider.  Eat soft foods for the first 24 hours. SEEK MEDICAL CARE IF: You have persistent spotting of blood in your stool 2-3 days after the procedure. SEEK IMMEDIATE MEDICAL CARE IF:  You have more than a small spotting of blood in your stool.  You pass  large blood clots in your stool.  Your abdomen is swollen (distended).  You have nausea or vomiting.  You have a fever.  You have increasing abdominal pain that is not relieved with medicine. Document Released: 01/19/2004 Document Revised: 03/27/2013 Document Reviewed: 02/11/2013 [facility] Patient Information 2015 [place], [place]. This information is not intended to replace advice given to you by your health care provider. Make sure you discuss any questions you have with your health care provider.

## 2013-12-06 NOTE — Anesthesia Preprocedure Evaluation (Signed)
Anesthesia Evaluation  Patient identified by MRN, date of birth, ID band Patient awake    Reviewed: Allergy & Precautions, H&P , NPO status , Patient's Chart, lab work & pertinent test results  History of Anesthesia Complications Negative for: history of anesthetic complications  Airway Mallampati: II TM Distance: >3 FB Neck ROM: Full    Dental  (+) Dental Advisory Given   Pulmonary former smoker,          Cardiovascular hypertension, Rhythm:Regular Rate:Normal     Neuro/Psych  Headaches, negative psych ROS   GI/Hepatic negative GI ROS, Neg liver ROS,   Endo/Other  diabetes, Type 2Morbid obesity  Renal/GU negative Renal ROS     Musculoskeletal negative musculoskeletal ROS (+)   Abdominal   Peds  Hematology negative hematology ROS (+)   Anesthesia Other Findings   Reproductive/Obstetrics negative OB ROS                           Anesthesia Physical Anesthesia Plan  ASA: III  Anesthesia Plan: MAC   Post-op Pain Management:    Induction: Intravenous  Airway Management Planned:   Additional Equipment:   Intra-op Plan:   Post-operative Plan:   Informed Consent: I have reviewed the patients History and Physical, chart, labs and discussed the procedure including the risks, benefits and alternatives for the proposed anesthesia with the patient or authorized representative who has indicated his/her understanding and acceptance.   Dental advisory given  Plan Discussed with: CRNA  Anesthesia Plan Comments:         Anesthesia Quick Evaluation

## 2013-12-06 NOTE — Op Note (Signed)
[facility] [place], [address]   COLONOSCOPY PROCEDURE REPORT  PATIENT: Taylor Barnes, Taylor Barnes  MR#: [id number] BIRTHDATE: [birth year] , 57  yrs. old GENDER: Female ENDOSCOPIST: Ronald Lobo, MD REFERRED BY:   Dr. Lennette Bihari little PROCEDURE DATE:  12/06/2013 PROCEDURE:     colonoscopy ASA CLASS: INDICATIONS:  followup of solitary diminutive adenoma 5 years ago MEDICATIONS:    MAC, per anesthesia  DESCRIPTION OF PROCEDURE: the patient came as an outpatient to the [facility] long endoscopy unit and provided written consent. Propofol sedation was administered and time out was performed.  The Pentax adult video colonoscope was advanced to the cecum with ease. The cecum was identified by visualization of the appendiceal orifice and ileocecal valve. CO2 insufflation was used for this exam. The quality of the prep was excellent it is felt that all areas were well seen.  This was a normal examination. No polyps, masses, diverticulosis, colitis, or vascular ectasia were noted.  Retroflexion in the rectum and reinspection of the rectosigmoid for unremarkable.  Particularly careful attention was paid to the proximal colon, with no lesions identified.     COMPLICATIONS: None  ENDOSCOPIC IMPRESSION:normal colonoscopy  RECOMMENDATIONS:repeat colonoscopy in 10 years, as per current guidelines for patients such as this with a history of a solitary diminutive adenoma and a  negative 5 year surveillance examination.    _______________________________ eSignedRonald Lobo, MD 12/06/2013 8:22 AM

## 2013-12-09 ENCOUNTER — Encounter (HOSPITAL_COMMUNITY): Payer: Self-pay | Admitting: Gastroenterology

## 2014-01-09 NOTE — Progress Notes (Signed)
Patient ID: Taylor Barnes, female   DOB: 1956-04-21, 58 y.o.   MRN: [id number] ATTENDING PHYSICIAN NOTE: I have reviewed the chart and agree with the plan as detailed above. Dorcas Mcmurray MD Pager [phone]

## 2014-04-02 ENCOUNTER — Ambulatory Visit (INDEPENDENT_AMBULATORY_CARE_PROVIDER_SITE_OTHER): Payer: Self-pay | Admitting: Family Medicine

## 2014-04-02 VITALS — BP 98/61 | HR 81 | Wt 229.0 lb

## 2014-04-02 DIAGNOSIS — E119 Type 2 diabetes mellitus without complications: Secondary | ICD-10-CM

## 2014-04-02 NOTE — Progress Notes (Signed)
Patient presents for 3 mo f/u DM as part of the employee sponsored Link to [employer]. Medications have been reviewed. I have also discussed with patient lifestyle interventions such as diet and exercise. Full documentation of this visit can be found in the [employer] documenting system through [facility]). However, specifics from this visit include the following:  Diabetes Mellitus: POC A1C 6, at goal on metformin. No recommended changes. Patient will track calories on live life well homepage. f/u 3 months  Hyperlipidemia: on appropriately dosed statin, no recommended changes.  Hypertension: BP running a little low. Patient states she does get dizzy sometimes. I told her to check it over the next 2 weeks and if still running low and/or dizzy to contact her physician for dose reduction of lisinopril.   Patient has set a series of personal goals and will f/u in 3 mo for further review of DM

## 2014-04-16 ENCOUNTER — Other Ambulatory Visit: Payer: Self-pay

## 2014-04-16 DIAGNOSIS — Z1239 Encounter for other screening for malignant neoplasm of breast: Secondary | ICD-10-CM

## 2014-05-06 NOTE — Progress Notes (Signed)
Patient ID: Taylor Barnes, female   DOB: Aug 23, 1955, 58 y.o.   MRN: [id number] Reviewed: Agree with the documentation and management of our [place].

## 2014-05-22 ENCOUNTER — Other Ambulatory Visit: Payer: Self-pay

## 2014-05-22 ENCOUNTER — Ambulatory Visit
Admission: RE | Admit: 2014-05-22 | Discharge: 2014-05-22 | Disposition: A | Discharge status: Home / Self Care | Payer: 59 | Source: Ambulatory Visit

## 2014-05-22 DIAGNOSIS — Z1231 Encounter for screening mammogram for malignant neoplasm of breast: Secondary | ICD-10-CM

## 2015-01-27 ENCOUNTER — Ambulatory Visit (INDEPENDENT_AMBULATORY_CARE_PROVIDER_SITE_OTHER): Payer: Self-pay | Admitting: Family Medicine

## 2015-01-27 ENCOUNTER — Ambulatory Visit: Payer: Self-pay

## 2015-01-27 VITALS — BP 129/75 | HR 90 | Wt 240.0 lb

## 2015-01-27 DIAGNOSIS — E119 Type 2 diabetes mellitus without complications: Secondary | ICD-10-CM

## 2015-01-27 LAB — POCT GLYCOSYLATED HEMOGLOBIN (HGB A1C): Hemoglobin A1C: 6.2

## 2015-01-27 NOTE — Patient Instructions (Signed)
Work on exercise. Goal 150 minutes per week.

## 2015-01-27 NOTE — Progress Notes (Signed)
Patient returns to pharmacy for 3 month f/u DM as part of the employee sponsored link to wellness program. Medications have been reviewed. I have also discussed with patient lifestyle interventions such as diet and exercise.  Diabetes- POC A1C 6.2, at goal although it has risen from the consistently being in the 5's. No recommended medications changes. Patient has gained weight but has moved in with her daughter and is now moving again so she has lost control of her diet in recent months. She will work on diet and exercise over the next few months to get her weight back down. She is going to start walking more.  Hypertension-blood pressure at goal  Lipids-on appropriately dosed statin  Patient has set a series of personal goals and will f/u in 3 months for further review of DM

## 2015-02-25 NOTE — Progress Notes (Signed)
ATTENDING PHYSICIAN NOTE: I have reviewed the chart and agree with the plan as detailed above. Neetu Carrozza MD Pager [phone]  

## 2015-04-22 ENCOUNTER — Other Ambulatory Visit: Payer: Self-pay

## 2015-04-22 DIAGNOSIS — Z1231 Encounter for screening mammogram for malignant neoplasm of breast: Secondary | ICD-10-CM

## 2015-05-26 ENCOUNTER — Ambulatory Visit (HOSPITAL_BASED_OUTPATIENT_CLINIC_OR_DEPARTMENT_OTHER): Payer: 59 | Attending: Internal Medicine | Admitting: Radiology

## 2015-05-26 ENCOUNTER — Ambulatory Visit
Admission: RE | Admit: 2015-05-26 | Discharge: 2015-05-26 | Disposition: A | Discharge status: Home / Self Care | Payer: 59 | Source: Ambulatory Visit

## 2015-05-26 DIAGNOSIS — G4733 Obstructive sleep apnea (adult) (pediatric): Secondary | ICD-10-CM | POA: Diagnosis present

## 2015-05-26 DIAGNOSIS — R400 Somnolence: Secondary | ICD-10-CM

## 2015-05-26 DIAGNOSIS — R0683 Snoring: Secondary | ICD-10-CM | POA: Diagnosis not present

## 2015-05-26 DIAGNOSIS — Z1231 Encounter for screening mammogram for malignant neoplasm of breast: Secondary | ICD-10-CM

## 2015-05-26 DIAGNOSIS — R451 Restlessness and agitation: Secondary | ICD-10-CM

## 2015-05-29 ENCOUNTER — Other Ambulatory Visit (HOSPITAL_COMMUNITY): Payer: Self-pay | Admitting: Orthopedic Surgery

## 2015-05-29 DIAGNOSIS — M7661 Achilles tendinitis, right leg: Secondary | ICD-10-CM

## 2015-05-30 DIAGNOSIS — R0683 Snoring: Secondary | ICD-10-CM | POA: Diagnosis not present

## 2015-05-30 DIAGNOSIS — R400 Somnolence: Secondary | ICD-10-CM | POA: Diagnosis not present

## 2015-05-30 DIAGNOSIS — R451 Restlessness and agitation: Secondary | ICD-10-CM | POA: Diagnosis not present

## 2015-05-30 NOTE — Progress Notes (Signed)
   Patient Name: Khiya, Westhoff Date: 05/26/2015 Gender: Female D.O.B: [birth year] Age (years): 59 Referring Provider: Carlena Sax Height (inches): 59 Interpreting Physician: Baird Lyons MD, ABSM Weight (lbs): 232 RPSGT: Jacolyn Reedy BMI: 40 MRN: [id number] Neck Size: 14.00 CLINICAL INFORMATION Sleep Study Type: Unattended home sleep test   Indication for sleep study: Obstructive sleep apnea   Epworth Sleepiness Score: 12  SLEEP STUDY TECHNIQUE A multi-channel overnight portable sleep study was performed. The channels recorded were: nasal airflow, thoracic respiratory movement, and oxygen saturation with a pulse oximetry. Snoring was also monitored.  MEDICATIONS Patient self administered medications include: charted for review.  SLEEP ARCHITECTURE Patient was studied for 367.4 minutes. The sleep efficiency was 87.4 % and the patient was supine for 85.6%. The arousal index was 0.0 per hour.  RESPIRATORY PARAMETERS The overall AHI was 49.3 per hour, with a central apnea index of 1.0 per hour. The oxygen nadir was 80% during sleep. Mean oxygen saturation 92%.   CARDIAC DATA Mean heart rate during sleep was 67.3 bpm.  IMPRESSIONS - Severe obstructive sleep apnea occurred during this study (AHI = 49.3/h). - No significant central sleep apnea occurred during this study (CAI = 1.0/h). - Severe oxygen desaturation was noted during this study (Min O2 = 80%). - Patient snored during 68.1% of sleep time.  DIAGNOSIS - Obstructive Sleep Apnea (327.23 [G47.33 ICD-10])  RECOMMENDATIONS - CPAP titration to determine optimum pressure for management - Avoid alcohol, sedatives and other CNS depressants that may worsen sleep apnea and disrupt normal sleep architecture. - Sleep hygiene should be reviewed to assess factors that may improve sleep quality. - Weight management and regular exercise should be initiated or continued.  Deneise Lever Diplomate, American  Board of Sleep Medicine  ELECTRONICALLY SIGNED ON:  05/30/2015, 10:00 AM [place] PH: [phone]   FX: [phone] [place]

## 2015-06-01 ENCOUNTER — Ambulatory Visit (HOSPITAL_COMMUNITY)
Admission: RE | Admit: 2015-06-01 | Discharge: 2015-06-01 | Disposition: A | Discharge status: Home / Self Care | Payer: 59 | Source: Ambulatory Visit | Attending: Orthopedic Surgery | Admitting: Orthopedic Surgery

## 2015-06-01 DIAGNOSIS — M7751 Other enthesopathy of right foot: Secondary | ICD-10-CM | POA: Insufficient documentation

## 2015-06-01 DIAGNOSIS — M7661 Achilles tendinitis, right leg: Secondary | ICD-10-CM | POA: Diagnosis not present

## 2015-06-01 DIAGNOSIS — S86011A Strain of right Achilles tendon, initial encounter: Secondary | ICD-10-CM | POA: Insufficient documentation

## 2015-06-01 DIAGNOSIS — X58XXXA Exposure to other specified factors, initial encounter: Secondary | ICD-10-CM | POA: Diagnosis not present

## 2015-06-18 ENCOUNTER — Other Ambulatory Visit: Payer: Self-pay | Admitting: Orthopedic Surgery

## 2015-06-24 DIAGNOSIS — I10 Essential (primary) hypertension: Secondary | ICD-10-CM | POA: Diagnosis not present

## 2015-06-24 DIAGNOSIS — E782 Mixed hyperlipidemia: Secondary | ICD-10-CM | POA: Diagnosis not present

## 2015-06-24 DIAGNOSIS — M79673 Pain in unspecified foot: Secondary | ICD-10-CM | POA: Diagnosis not present

## 2015-06-24 DIAGNOSIS — K760 Fatty (change of) liver, not elsewhere classified: Secondary | ICD-10-CM | POA: Diagnosis not present

## 2015-06-24 DIAGNOSIS — F338 Other recurrent depressive disorders: Secondary | ICD-10-CM | POA: Diagnosis not present

## 2015-06-24 DIAGNOSIS — Z86010 Personal history of colonic polyps: Secondary | ICD-10-CM | POA: Diagnosis not present

## 2015-06-24 DIAGNOSIS — G4730 Sleep apnea, unspecified: Secondary | ICD-10-CM | POA: Diagnosis not present

## 2015-06-24 DIAGNOSIS — Z0000 Encounter for general adult medical examination without abnormal findings: Secondary | ICD-10-CM | POA: Diagnosis not present

## 2015-06-24 DIAGNOSIS — E119 Type 2 diabetes mellitus without complications: Secondary | ICD-10-CM | POA: Diagnosis not present

## 2015-07-06 ENCOUNTER — Encounter (HOSPITAL_BASED_OUTPATIENT_CLINIC_OR_DEPARTMENT_OTHER): Payer: Self-pay | Admitting: *Deleted

## 2015-07-09 ENCOUNTER — Encounter (HOSPITAL_BASED_OUTPATIENT_CLINIC_OR_DEPARTMENT_OTHER): Payer: Self-pay | Admitting: Certified Registered"

## 2015-07-09 ENCOUNTER — Ambulatory Visit (HOSPITAL_BASED_OUTPATIENT_CLINIC_OR_DEPARTMENT_OTHER): Payer: 59 | Admitting: Certified Registered"

## 2015-07-09 ENCOUNTER — Encounter (HOSPITAL_BASED_OUTPATIENT_CLINIC_OR_DEPARTMENT_OTHER)
Admission: RE | Disposition: A | Discharge status: Home / Self Care | Payer: Self-pay | Source: Ambulatory Visit | Attending: Orthopedic Surgery

## 2015-07-09 ENCOUNTER — Ambulatory Visit (HOSPITAL_BASED_OUTPATIENT_CLINIC_OR_DEPARTMENT_OTHER)
Admission: RE | Admit: 2015-07-09 | Discharge: 2015-07-09 | Disposition: A | Discharge status: Home / Self Care | Payer: 59 | Source: Ambulatory Visit | Attending: Orthopedic Surgery | Admitting: Orthopedic Surgery

## 2015-07-09 DIAGNOSIS — M7661 Achilles tendinitis, right leg: Secondary | ICD-10-CM | POA: Insufficient documentation

## 2015-07-09 DIAGNOSIS — M19012 Primary osteoarthritis, left shoulder: Secondary | ICD-10-CM | POA: Insufficient documentation

## 2015-07-09 DIAGNOSIS — E119 Type 2 diabetes mellitus without complications: Secondary | ICD-10-CM | POA: Diagnosis not present

## 2015-07-09 DIAGNOSIS — E785 Hyperlipidemia, unspecified: Secondary | ICD-10-CM | POA: Insufficient documentation

## 2015-07-09 DIAGNOSIS — M79661 Pain in right lower leg: Secondary | ICD-10-CM | POA: Diagnosis not present

## 2015-07-09 DIAGNOSIS — Z9049 Acquired absence of other specified parts of digestive tract: Secondary | ICD-10-CM | POA: Diagnosis not present

## 2015-07-09 DIAGNOSIS — Z6839 Body mass index (BMI) 39.0-39.9, adult: Secondary | ICD-10-CM | POA: Insufficient documentation

## 2015-07-09 DIAGNOSIS — M19011 Primary osteoarthritis, right shoulder: Secondary | ICD-10-CM | POA: Diagnosis not present

## 2015-07-09 DIAGNOSIS — I10 Essential (primary) hypertension: Secondary | ICD-10-CM | POA: Diagnosis not present

## 2015-07-09 DIAGNOSIS — F329 Major depressive disorder, single episode, unspecified: Secondary | ICD-10-CM | POA: Diagnosis not present

## 2015-07-09 DIAGNOSIS — M6701 Short Achilles tendon (acquired), right ankle: Secondary | ICD-10-CM | POA: Insufficient documentation

## 2015-07-09 DIAGNOSIS — G4730 Sleep apnea, unspecified: Secondary | ICD-10-CM | POA: Diagnosis not present

## 2015-07-09 DIAGNOSIS — Z79899 Other long term (current) drug therapy: Secondary | ICD-10-CM | POA: Insufficient documentation

## 2015-07-09 DIAGNOSIS — F419 Anxiety disorder, unspecified: Secondary | ICD-10-CM | POA: Insufficient documentation

## 2015-07-09 DIAGNOSIS — M7730 Calcaneal spur, unspecified foot: Secondary | ICD-10-CM | POA: Diagnosis not present

## 2015-07-09 DIAGNOSIS — Z87891 Personal history of nicotine dependence: Secondary | ICD-10-CM | POA: Insufficient documentation

## 2015-07-09 DIAGNOSIS — G8918 Other acute postprocedural pain: Secondary | ICD-10-CM | POA: Diagnosis not present

## 2015-07-09 DIAGNOSIS — Z7984 Long term (current) use of oral hypoglycemic drugs: Secondary | ICD-10-CM | POA: Insufficient documentation

## 2015-07-09 DIAGNOSIS — M9261 Juvenile osteochondrosis of tarsus, right ankle: Secondary | ICD-10-CM | POA: Diagnosis not present

## 2015-07-09 HISTORY — DX: Sleep apnea, unspecified: G47.30

## 2015-07-09 HISTORY — PX: GASTROCNEMIUS RECESSION: SHX863

## 2015-07-09 HISTORY — DX: Anxiety disorder, unspecified: F41.9

## 2015-07-09 HISTORY — PX: EXCISION HAGLUND'S DEFORMITY WITH ACHILLES TENDON REPAIR: SHX5627

## 2015-07-09 HISTORY — DX: Major depressive disorder, single episode, unspecified: F32.9

## 2015-07-09 HISTORY — DX: Depression, unspecified: F32.A

## 2015-07-09 HISTORY — DX: Achilles tendinitis, right leg: M76.61

## 2015-07-09 HISTORY — DX: Hyperlipidemia, unspecified: E78.5

## 2015-07-09 LAB — GLUCOSE, CAPILLARY
Glucose-Capillary: 88 mg/dL (ref 65–99)
Glucose-Capillary: 152 mg/dL — ABNORMAL HIGH (ref 65–99)

## 2015-07-09 SURGERY — RECESSION, MUSCLE, GASTROCNEMIUS
Anesthesia: Regional | Site: Leg Lower | Laterality: Right

## 2015-07-09 MED ORDER — SODIUM CHLORIDE 0.9 % IV SOLN: INTRAVENOUS | Status: DC

## 2015-07-09 MED ORDER — OXYCODONE HCL 5 MG/5ML PO SOLN: 5.0000 mg | Freq: Once | ORAL | Status: DC | PRN

## 2015-07-09 MED ORDER — FENTANYL CITRATE (PF) 100 MCG/2ML IJ SOLN
INTRAMUSCULAR | Status: AC
  Filled 2015-07-09: qty 2

## 2015-07-09 MED ORDER — PROMETHAZINE HCL 25 MG/ML IJ SOLN
6.2500 mg | INTRAMUSCULAR | Status: AC | PRN
  Administered 2015-07-09 (×2): 6.25 mg via INTRAVENOUS

## 2015-07-09 MED ORDER — FENTANYL CITRATE (PF) 100 MCG/2ML IJ SOLN
50.0000 ug | INTRAMUSCULAR | Status: DC | PRN
  Administered 2015-07-09: 50 ug via INTRAVENOUS
  Administered 2015-07-09: 100 ug via INTRAVENOUS

## 2015-07-09 MED ORDER — SODIUM CHLORIDE 0.9 % IJ SOLN
INTRAMUSCULAR | Status: AC
  Filled 2015-07-09: qty 10

## 2015-07-09 MED ORDER — OXYCODONE HCL 5 MG PO TABS: 5.0000 mg | ORAL_TABLET | ORAL | Status: DC | PRN

## 2015-07-09 MED ORDER — LIDOCAINE HCL (CARDIAC) 20 MG/ML IV SOLN
INTRAVENOUS | Status: AC
  Filled 2015-07-09: qty 5

## 2015-07-09 MED ORDER — ONDANSETRON HCL 4 MG/2ML IJ SOLN
INTRAMUSCULAR | Status: AC
  Filled 2015-07-09: qty 2

## 2015-07-09 MED ORDER — LIDOCAINE HCL (CARDIAC) 20 MG/ML IV SOLN
INTRAVENOUS | Status: DC | PRN
  Administered 2015-07-09: 20 mg via INTRAVENOUS

## 2015-07-09 MED ORDER — VANCOMYCIN HCL 500 MG IV SOLR
INTRAVENOUS | Status: DC | PRN
  Administered 2015-07-09: 500 mg via TOPICAL

## 2015-07-09 MED ORDER — PROPOFOL 10 MG/ML IV BOLUS
INTRAVENOUS | Status: DC | PRN
  Administered 2015-07-09: 150 mg via INTRAVENOUS

## 2015-07-09 MED ORDER — ONDANSETRON HCL 4 MG/2ML IJ SOLN
INTRAMUSCULAR | Status: DC | PRN
  Administered 2015-07-09: 4 mg via INTRAVENOUS

## 2015-07-09 MED ORDER — OXYCODONE HCL 5 MG PO TABS: 5.0000 mg | ORAL_TABLET | Freq: Once | ORAL | Status: DC | PRN

## 2015-07-09 MED ORDER — GLYCOPYRROLATE 0.2 MG/ML IJ SOLN: 0.2000 mg | Freq: Once | INTRAMUSCULAR | Status: DC | PRN

## 2015-07-09 MED ORDER — MIDAZOLAM HCL 2 MG/2ML IJ SOLN
1.0000 mg | INTRAMUSCULAR | Status: DC | PRN
  Administered 2015-07-09: 2 mg via INTRAVENOUS

## 2015-07-09 MED ORDER — SUCCINYLCHOLINE CHLORIDE 20 MG/ML IJ SOLN
INTRAMUSCULAR | Status: AC
  Filled 2015-07-09: qty 1

## 2015-07-09 MED ORDER — SCOPOLAMINE 1 MG/3DAYS TD PT72
MEDICATED_PATCH | TRANSDERMAL | Status: AC
  Filled 2015-07-09: qty 1

## 2015-07-09 MED ORDER — CEFAZOLIN SODIUM-DEXTROSE 2-3 GM-% IV SOLR
2.0000 g | INTRAVENOUS | Status: AC
  Administered 2015-07-09: 2 g via INTRAVENOUS

## 2015-07-09 MED ORDER — LACTATED RINGERS IV SOLN
INTRAVENOUS | Status: DC
  Administered 2015-07-09 (×2): via INTRAVENOUS

## 2015-07-09 MED ORDER — 0.9 % SODIUM CHLORIDE (POUR BTL) OPTIME
TOPICAL | Status: DC | PRN
  Administered 2015-07-09: 200 mL

## 2015-07-09 MED ORDER — ONDANSETRON HCL 4 MG/2ML IJ SOLN: 4.0000 mg | Freq: Four times a day (QID) | INTRAMUSCULAR | Status: DC | PRN

## 2015-07-09 MED ORDER — PROMETHAZINE HCL 25 MG/ML IJ SOLN
INTRAMUSCULAR | Status: AC
  Filled 2015-07-09: qty 1

## 2015-07-09 MED ORDER — CHLORHEXIDINE GLUCONATE 4 % EX LIQD: 60.0000 mL | Freq: Once | CUTANEOUS | Status: DC

## 2015-07-09 MED ORDER — DEXAMETHASONE SODIUM PHOSPHATE 10 MG/ML IJ SOLN
INTRAMUSCULAR | Status: AC
  Filled 2015-07-09: qty 1

## 2015-07-09 MED ORDER — SUCCINYLCHOLINE CHLORIDE 20 MG/ML IJ SOLN
INTRAMUSCULAR | Status: DC | PRN
  Administered 2015-07-09: 100 mg via INTRAVENOUS

## 2015-07-09 MED ORDER — SCOPOLAMINE 1 MG/3DAYS TD PT72
1.0000 | MEDICATED_PATCH | Freq: Once | TRANSDERMAL | Status: AC
  Administered 2015-07-09: 1 via TRANSDERMAL

## 2015-07-09 MED ORDER — HYDROMORPHONE HCL 1 MG/ML IJ SOLN: 0.2500 mg | INTRAMUSCULAR | Status: DC | PRN

## 2015-07-09 MED ORDER — CEFAZOLIN SODIUM-DEXTROSE 2-3 GM-% IV SOLR
INTRAVENOUS | Status: AC
  Filled 2015-07-09: qty 50

## 2015-07-09 MED ORDER — DEXAMETHASONE SODIUM PHOSPHATE 4 MG/ML IJ SOLN
INTRAMUSCULAR | Status: DC | PRN
  Administered 2015-07-09: 4 mg via INTRAVENOUS

## 2015-07-09 MED ORDER — BUPIVACAINE-EPINEPHRINE (PF) 0.5% -1:200000 IJ SOLN
INTRAMUSCULAR | Status: DC | PRN
  Administered 2015-07-09 (×2): 20 mL via PERINEURAL

## 2015-07-09 MED ORDER — MIDAZOLAM HCL 2 MG/2ML IJ SOLN
INTRAMUSCULAR | Status: AC
  Filled 2015-07-09: qty 2

## 2015-07-09 MED FILL — oxyCODONE HCL 5 MG TABS: 5 | 2 days supply | Qty: 30 | Fill #0

## 2015-07-09 SURGICAL SUPPLY — 91 items
ANCHOR JUGGERKNOT WTAP NDL 2.9 (Anchor) ×8 IMPLANT
BANDAGE ESMARK 6X9 LF (GAUZE/BANDAGES/DRESSINGS) ×3 IMPLANT
BIT DRILL JUGRKNT W/NDL BIT2.9 (DRILL) ×3 IMPLANT
BLADE ARTHRO LOK 4 BEAVER (BLADE) IMPLANT
BLADE AVERAGE 25X9 (BLADE) IMPLANT
BLADE MICRO SAGITTAL (BLADE) ×4 IMPLANT
BLADE OSC/SAG .038X5.5 CUT EDG (BLADE) IMPLANT
BLADE SURG 15 STRL LF DISP TIS (BLADE) ×9 IMPLANT
BLADE SURG 15 STRL SS (BLADE) ×3
BNDG COHESIVE 4X5 TAN STRL (GAUZE/BANDAGES/DRESSINGS) ×4 IMPLANT
BNDG COHESIVE 6X5 TAN STRL LF (GAUZE/BANDAGES/DRESSINGS) ×4 IMPLANT
BNDG CONFORM 2 STRL LF (GAUZE/BANDAGES/DRESSINGS) IMPLANT
BNDG ESMARK 6X9 LF (GAUZE/BANDAGES/DRESSINGS) ×4
BOOT STEPPER DURA LG (SOFTGOODS) IMPLANT
BOOT STEPPER DURA MED (SOFTGOODS) IMPLANT
CANISTER SUCT 1200ML W/VALVE (MISCELLANEOUS) ×4 IMPLANT
CHLORAPREP W/TINT 26ML (MISCELLANEOUS) ×4 IMPLANT
COVER BACK TABLE 60X90IN (DRAPES) ×4 IMPLANT
CUFF TOURNIQUET SINGLE 34IN LL (TOURNIQUET CUFF) ×4 IMPLANT
DECANTER SPIKE VIAL GLASS SM (MISCELLANEOUS) IMPLANT
DRAPE EXTREMITY T 121X128X90 (DRAPE) ×4 IMPLANT
DRAPE OEC MINIVIEW 54X84 (DRAPES) IMPLANT
DRAPE U-SHAPE 47X51 STRL (DRAPES) ×4 IMPLANT
DRILL JUGGERKNOT W/NDL BIT 2.9 (DRILL) ×4
DRSG MEPITEL 4X7.2 (GAUZE/BANDAGES/DRESSINGS) ×4 IMPLANT
DRSG PAD ABDOMINAL 8X10 ST (GAUZE/BANDAGES/DRESSINGS) ×8 IMPLANT
ELECT REM PT RETURN 9FT ADLT (ELECTROSURGICAL) ×4
ELECTRODE REM PT RTRN 9FT ADLT (ELECTROSURGICAL) ×3 IMPLANT
GAUZE SPONGE 4X4 12PLY STRL (GAUZE/BANDAGES/DRESSINGS) ×4 IMPLANT
GLOVE BIO SURGEON STRL SZ8 (GLOVE) ×4 IMPLANT
GLOVE BIOGEL PI IND STRL 7.0 (GLOVE) ×3 IMPLANT
GLOVE BIOGEL PI IND STRL 8 (GLOVE) ×3 IMPLANT
GLOVE BIOGEL PI INDICATOR 7.0 (GLOVE) ×1
GLOVE BIOGEL PI INDICATOR 8 (GLOVE) ×1
GLOVE ECLIPSE 6.5 STRL STRAW (GLOVE) ×4 IMPLANT
GLOVE ECLIPSE 7.5 STRL STRAW (GLOVE) IMPLANT
GLOVE EXAM NITRILE MD LF STRL (GLOVE) ×4 IMPLANT
GOWN STRL REUS W/ TWL LRG LVL3 (GOWN DISPOSABLE) ×3 IMPLANT
GOWN STRL REUS W/ TWL XL LVL3 (GOWN DISPOSABLE) ×3 IMPLANT
GOWN STRL REUS W/TWL LRG LVL3 (GOWN DISPOSABLE) ×1
GOWN STRL REUS W/TWL XL LVL3 (GOWN DISPOSABLE) ×1
IMPL ANCHOR PEEK 4.5MM (Anchor) ×6 IMPLANT
IMPLANT ANCHOR PEEK 4.5MM (Anchor) ×8 IMPLANT
KIT BIO-TENODESIS 3X8 DISP (MISCELLANEOUS)
KIT INSRT BABSR STRL DISP BTN (MISCELLANEOUS) IMPLANT
NDL SAFETY ECLIPSE 18X1.5 (NEEDLE) IMPLANT
NDL SUT 6 .5 CRC .975X.05 MAYO (NEEDLE) IMPLANT
NEEDLE HYPO 18GX1.5 SHARP (NEEDLE)
NEEDLE HYPO 22GX1.5 SAFETY (NEEDLE) IMPLANT
NEEDLE HYPO 25X1 1.5 SAFETY (NEEDLE) IMPLANT
NEEDLE MAYO TAPER (NEEDLE)
NS IRRIG 1000ML POUR BTL (IV SOLUTION) ×4 IMPLANT
PACK BASIN DAY SURGERY FS (CUSTOM PROCEDURE TRAY) ×4 IMPLANT
PAD CAST 4YDX4 CTTN HI CHSV (CAST SUPPLIES) ×3 IMPLANT
PADDING CAST ABS 4INX4YD NS (CAST SUPPLIES)
PADDING CAST ABS COTTON 4X4 ST (CAST SUPPLIES) IMPLANT
PADDING CAST COTTON 4X4 STRL (CAST SUPPLIES) ×1
PADDING CAST COTTON 6X4 STRL (CAST SUPPLIES) ×4 IMPLANT
PASSER SUT SWANSON 36MM LOOP (INSTRUMENTS) IMPLANT
PENCIL BUTTON HOLSTER BLD 10FT (ELECTRODE) ×4 IMPLANT
RETRIEVER SUT HEWSON (MISCELLANEOUS) IMPLANT
SANITIZER HAND PURELL 535ML FO (MISCELLANEOUS) ×4 IMPLANT
SHEET MEDIUM DRAPE 40X70 STRL (DRAPES) ×4 IMPLANT
SLEEVE SCD COMPRESS KNEE MED (MISCELLANEOUS) ×4 IMPLANT
SPLINT FAST PLASTER 5X30 (CAST SUPPLIES) ×20
SPLINT PLASTER CAST FAST 5X30 (CAST SUPPLIES) ×60 IMPLANT
SPONGE LAP 18X18 X RAY DECT (DISPOSABLE) ×4 IMPLANT
STAPLER VISISTAT 35W (STAPLE) IMPLANT
STOCKINETTE 6  STRL (DRAPES) ×1
STOCKINETTE 6 STRL (DRAPES) ×3 IMPLANT
SUCTION FRAZIER HANDLE 10FR (MISCELLANEOUS) ×1
SUCTION TUBE FRAZIER 10FR DISP (MISCELLANEOUS) ×3 IMPLANT
SUT ETHIBOND 2 OS 4 DA (SUTURE) IMPLANT
SUT ETHIBOND 3-0 V-5 (SUTURE) IMPLANT
SUT ETHILON 3 0 PS 1 (SUTURE) ×4 IMPLANT
SUT FIBERWIRE #2 38 T-5 BLUE (SUTURE)
SUT MERSILENE 2.0 SH NDLE (SUTURE) IMPLANT
SUT MNCRL AB 3-0 PS2 18 (SUTURE) ×8 IMPLANT
SUT MNCRL AB 4-0 PS2 18 (SUTURE) IMPLANT
SUT VIC AB 0 CT1 27 (SUTURE) ×1
SUT VIC AB 0 CT1 27XBRD ANBCTR (SUTURE) ×3 IMPLANT
SUT VIC AB 0 SH 27 (SUTURE) IMPLANT
SUT VIC AB 2-0 SH 27 (SUTURE)
SUT VIC AB 2-0 SH 27XBRD (SUTURE) IMPLANT
SUTURE FIBERWR #2 38 T-5 BLUE (SUTURE) IMPLANT
SYR BULB 3OZ (MISCELLANEOUS) ×4 IMPLANT
SYR CONTROL 10ML LL (SYRINGE) IMPLANT
TOWEL OR 17X24 6PK STRL BLUE (TOWEL DISPOSABLE) ×8 IMPLANT
TUBE CONNECTING 20X1/4 (TUBING) ×4 IMPLANT
UNDERPAD 30X30 (UNDERPADS AND DIAPERS) ×4 IMPLANT
YANKAUER SUCT BULB TIP NO VENT (SUCTIONS) IMPLANT

## 2015-07-09 NOTE — Anesthesia Procedure Notes (Addendum)
Anesthesia Regional Block:  Popliteal block  Pre-Anesthetic Checklist: ,, timeout performed, Correct Patient, Correct Site, Correct Laterality, Correct Procedure, Correct Position, site marked, Risks and benefits discussed,  Surgical consent,  Pre-op evaluation,  At surgeon's request and post-op pain management  Laterality: Right  Prep: chloraprep       Needles:  Injection technique: Single-shot  Needle Type: Echogenic Stimulator Needle     Needle Length: 9cm 9 cm Needle Gauge: 21 and 21 G    Additional Needles:  Procedures: ultrasound guided (picture in chart) and nerve stimulator Popliteal block  Nerve Stimulator or Paresthesia:  Response: plantar flexion of foot, 0.45 mA,   Additional Responses:   Narrative:  Start time: 07/09/2015 1:26 PM End time: 07/09/2015 1:39 PM Injection made incrementally with aspirations every 5 mL.  Performed by: Personally  Anesthesiologist: HODIERNE, ADAM  Additional Notes: Functioning IV was confirmed and monitors were applied.  A 45mm 21ga Arrow echogenic stimulator needle was used. Sterile prep and drape,hand hygiene and sterile gloves were used.  Negative aspiration and negative test dose prior to incremental administration of local anesthetic(20 mL of 0.5% bupivacaine + epi). The patient tolerated the procedure well.  Ultrasound guidance: relevent anatomy identified, needle position confirmed, local anesthetic spread visualized around nerve(s), vascular puncture avoided.  Image printed for medical record.   Right side adductor canal block also performed.  Sterile technique. Used ultrasound.  20 mL of 0.5% bupivacaine +epi was injected incrementally.  Pt tolerated the procedure well.   Procedure Name: Intubation Date/Time: 07/09/2015 1:52 PM Performed by: Aibhlinn Kalmar D Pre-anesthesia Checklist: Patient identified, Emergency Drugs available, Suction available and Patient being monitored Patient Re-evaluated:Patient Re-evaluated prior  to inductionOxygen Delivery Method: Circle System Utilized Preoxygenation: Pre-oxygenation with 100% oxygen Intubation Type: IV induction Ventilation: Mask ventilation without difficulty Laryngoscope Size: Mac and 3 Grade View: Grade I Tube type: Oral Tube size: 7.0 mm Number of attempts: 1 Airway Equipment and Method: Stylet and Oral airway Placement Confirmation: ETT inserted through vocal cords under direct vision,  positive ETCO2 and breath sounds checked- equal and bilateral Secured at: 21 cm Tube secured with: Tape Dental Injury: Teeth and Oropharynx as per pre-operative assessment

## 2015-07-09 NOTE — Progress Notes (Signed)
Assisted Dr. Marcie Bal with right, ultrasound guided, popliteal/saphenous block. Side rails up, monitors on throughout procedure. See vital signs in flow sheet. Tolerated Procedure well.

## 2015-07-09 NOTE — Transfer of Care (Signed)
Immediate Anesthesia Transfer of Care Note  Patient: Taylor Barnes  Procedure(s) Performed: Procedure(s): RIGHT GASTROC RECESSION  (Right) ACHILLES DEBRIDEMENT AND RECONSTRUCTION; EXCISION OF HAGLUND DEFORMITY (Right)  Patient Location: PACU  Anesthesia Type:GA combined with regional for post-op pain  Level of Consciousness: awake, alert , oriented and patient cooperative  Airway & Oxygen Therapy: Patient Spontanous Breathing and Patient connected to face mask oxygen  Post-op Assessment: Report given to RN and Post -op Vital signs reviewed and stable  Post vital signs: Reviewed and stable  Last Vitals:  Filed Vitals:   07/09/15 1333 07/09/15 1334  BP: 110/60   Pulse: 67 66  Temp:    Resp: 20 19    Complications: No apparent anesthesia complications

## 2015-07-09 NOTE — Op Note (Signed)
[name], [name]              ACCOUNT NO.:  [id number]  MEDICAL RECORD NO.:  [id number]  LOCATION:                                 FACILITY:  PHYSICIAN:  Wylene Simmer, MD        DATE OF BIRTH:  05/09/1956  DATE OF PROCEDURE:  07/09/2015 DATE OF DISCHARGE:                              OPERATIVE REPORT   PREOPERATIVE DIAGNOSES: 1. Right Achilles insertional tendinopathy. 2. Tight right heel cord. 3. Right calcaneus Haglund deformity.  POSTOPERATIVE DIAGNOSES: 1. Right Achilles insertional tendinopathy. 2. Tight right heel cord. 3. Right calcaneus Haglund deformity.  PROCEDURE: 1. Right gastrocnemius recession through a separate incision. 2. Right Achilles tendon debridement and reconstruction. 3. Excision of right Haglund deformity.  SURGEON:  Wylene Simmer, MD.  ANESTHESIA:  General, regional.  ESTIMATED BLOOD LOSS:  Minimal.  TOURNIQUET TIME:  58 minutes at 250 mmHg.  COMPLICATIONS:  None apparent.  DISPOSITION:  Extubated, awake, and stable to recovery.  INDICATIONS FOR PROCEDURE:  The patient is a 60 year old female with a long history of right heel pain.  She has failed nonoperative treatment to date and presents today for surgical treatment.  She understands the risks and benefits, the alternative treatment options, and elects surgical treatment.  She specifically understands risks of bleeding, infection, nerve damage, blood clots, need for additional surgery, continued pain, recurrence of her deformity, amputation, and death.  PROCEDURE IN DETAIL:  After preoperative consent was obtained and the correct operative site was identified, the patient was brought to the operating room supine on a stretcher.  General anesthesia was induced. Preoperative antibiotics were administered.  Surgical time-out was taken.  The right lower extremity was exsanguinated and a thigh tourniquet inflated to 250 mmHg.  The patient was then turned into the prone position on the  operating table with all bony prominences padded well.  Right lower extremity was prepped and draped in standard sterile fashion.  A longitudinal incision was then made at the insertion of the gastrocnemius tendon.  Sharp dissection was carried down to skin and subcutaneous tissue.  The sural nerve was protected.  The gastrocnemius tendon was divided under direct vision in its entirety.  The wound was irrigated copiously and Monocryl and nylon were used to close the incision.  Vancomycin powder was sprinkled in the wound prior to closure.  Attention was then turned to the posterior aspect of the heel where longitudinal incision was made.  Sharp dissection was carried down through skin, subcutaneous tissue, and paratenon.  The tendon was then split longitudinally and released from its insertion medially and laterally.  A large insertional enthesophyte was noted at the posterior calcaneus.  An oscillating saw was then used to resect that along with the Haglund deformity creating a healthy exposed bone for Achilles tendon reconstruction.  The tendon itself was then debrided of all degenerated portions.  This left greater than 50% of the tendon intact. The healthy tendon was then reconstructed by attaching it to the healthy cut surface of bone using Biomet suture anchors.  This created an hourglass pattern of suture between 4 anchors securing the footprint of the tendon against the cut surface of bone.  The ankle  was dorsiflexed just past neutral with no gapping evident at the tendon reconstruction site.  Wound was irrigated copiously.  The longitudinal split in the tendon was repaired with horizontal mattress sutures of 0 Vicryl. Vancomycin powder was sprinkled on the wound.  Paratenon was repaired with inverted simple sutures of 3-0 Monocryl.  Skin incision was closed with horizontal mattress sutures of 3-0 nylon.  Sterile dressings were applied followed by a well-padded short-leg splint.   Tourniquet was released after application of the dressings at 58 minutes.  The patient was awakened from anesthesia and transported to the recovery room in stable condition.  FOLLOWUP PLAN:  The patient will be nonweightbearing on the right lower extremity.  She will follow up with me in the office in 2 weeks for suture removal and conversion to a CAM walker boot.     Wylene Simmer, MD     JH/MEDQ  D:  07/09/2015  T:  07/09/2015  Job:  [id number]

## 2015-07-09 NOTE — H&P (Signed)
Taylor Barnes is an 60 y.o. female.   Chief Complaint: right heel pain HPI: 60 y/o female with right achilles tendonitis, tight heelcord and Haglund deformity presents today for surgical correction of these painful conditions.  She has failed all non operative treatment to date.  Past Medical History  Diagnosis Date  . Headache(784.0)     hx of migraines  . Arthritis     both shoulders  . AC (acromioclavicular) joint bone spurs     both feet  . Complication of anesthesia     trouble waking up  . Diabetes mellitus without complication ([place])     takes lisinopril to protect kidneys  . Depression   . Anxiety   . Hyperlipidemia   . Sleep apnea     CPAP nightly  . Tendonitis, Achilles, right     Past Surgical History  Procedure Laterality Date  . Lung biopsy  age 6  . Bilateral temporomandibular joint arthroplasty  age 58  . Breast biopsy Right yrs ago  . Carpal tunnel release Bilateral     multiple times both hands  .   c sections      x 2  . Cholecystectomy  yrs ago  . Dilation and curettage of uterus  yrs ago  . Cyst removed from hand  yrs ago  . Ablation      uteres  . Colonoscopy N/A 12/06/2013    Procedure: COLONOSCOPY;  Surgeon: Cleotis Nipper, MD;  Location: [facility];  Service: Endoscopy;  Laterality: N/A;    History reviewed. No pertinent family history. Social History:  reports that she quit smoking about 29 years ago. She has never used smokeless tobacco. She reports that she does not drink alcohol or use illicit drugs.  Allergies:  Allergies  Allergen Reactions  . Aspirin Nausea And Vomiting    Medications Prior to Admission  Medication Sig Dispense Refill  . atorvastatin (LIPITOR) 40 MG tablet Take 40 mg by mouth every morning.     Marland Kitchen lisinopril (PRINIVIL,ZESTRIL) 5 MG tablet Take 5 mg by mouth every morning.     . metFORMIN (GLUCOPHAGE) 500 MG tablet Take 500-1,000 mg by mouth 2 (two) times daily with a meal. 1 tablet in the morning and 2 in the  afternoon    . sertraline (ZOLOFT) 100 MG tablet Take 200 mg by mouth daily.    . meloxicam (MOBIC) 15 MG tablet Take 15 mg by mouth daily.      No results found for this or any previous visit (from the past 48 hour(s)). No results found.  ROS  No recent f/c/nv/wt/ lss  Blood pressure 119/70, pulse 73, temperature 98.3 F (36.8 C), temperature source Oral, resp. rate 16, height 5\' 4"  (1.626 m), weight 104.951 kg (231 lb 6 oz), SpO2 97 %. Physical Exam  wn wd woman in nad.  A and O x 4.  Mood and affect normal.  EOMI.  resp unlabored.  R heel TTP at insertion of the achilles.  Sens to LT intact at the foot.  Heelcord is tight.  5/5 strength in PF and DF.  No lympadenoapthy.  Skin o/w heatlhy.  Assessment/Plan R achilles tendonitis, Haglund deformity, tight heelcord - to OR for excision of Haglund deformity, gastroc recession and achilles tendon debridement and reconstruction.  The risks and benefits of the alternative treatment options have been discussed in detail.  The patient wishes to proceed with surgery and specifically understands risks of bleeding, infection, nerve damage, blood clots, need  for additional surgery, amputation and death.   Taylor Barnes 07-13-2015, 1:40 PM

## 2015-07-09 NOTE — Anesthesia Postprocedure Evaluation (Signed)
Anesthesia Post Note  Patient: Taylor Barnes  Procedure(s) Performed: Procedure(s) (LRB): RIGHT GASTROC RECESSION  (Right) ACHILLES DEBRIDEMENT AND RECONSTRUCTION; EXCISION OF HAGLUND DEFORMITY (Right)  Patient location during evaluation: PACU Anesthesia Type: General Level of consciousness: awake and alert and patient cooperative Pain management: pain level controlled Vital Signs Assessment: post-procedure vital signs reviewed and stable Respiratory status: spontaneous breathing and respiratory function stable Cardiovascular status: stable Anesthetic complications: no    Last Vitals:  Filed Vitals:   07/09/15 1615 07/09/15 1645  BP: 125/71 108/59  Pulse: 76 71  Temp:  36.4 C  Resp: 24 16    Last Pain:  Filed Vitals:   07/09/15 1647  PainSc: 0-No pain        RLE Motor Response: Purposeful movement (07/09/15 1645) RLE Sensation: Numbness;Tingling (07/09/15 1645)      [place]

## 2015-07-09 NOTE — Discharge Instructions (Signed)
° ° °  Regional Anesthesia Blocks ° °1. Numbness or the inability to move the "blocked" extremity may last from 3-48 hours after placement. The length of time depends on the medication injected and your individual response to the medication. If the numbness is not going away after 48 hours, call your surgeon. ° °2. The extremity that is blocked will need to be protected until the numbness is gone and the  Strength has returned. Because you cannot feel it, you will need to take extra care to avoid injury. Because it may be weak, you may have difficulty moving it or using it. You may not know what position it is in without looking at it while the block is in effect. ° °3. For blocks in the legs and feet, returning to weight bearing and walking needs to be done carefully. You will need to wait until the numbness is entirely gone and the strength has returned. You should be able to move your leg and foot normally before you try and bear weight or walk. You will need someone to be with you when you first try to ensure you do not fall and possibly risk injury. ° °4. Bruising and tenderness at the needle site are common side effects and will resolve in a few days. ° °5. Persistent numbness or new problems with movement should be communicated to the surgeon or the [facility] ([phone])/ [facility] ([phone]). ° ° ° °Post Anesthesia Home Care Instructions ° °Activity: °Get plenty of rest for the remainder of the day. A responsible adult should stay with you for 24 hours following the procedure.  °For the next 24 hours, DO NOT: °-Drive a car °-Operate machinery °-Drink alcoholic beverages °-Take any medication unless instructed by your physician °-Make any legal decisions or sign important papers. ° °Meals: °Start with liquid foods such as gelatin or soup. Progress to regular foods as tolerated. Avoid greasy, spicy, heavy foods. If nausea and/or vomiting occur, drink only clear liquids until  the nausea and/or vomiting subsides. Call your physician if vomiting continues. ° °Special Instructions/Symptoms: °Your throat may feel dry or sore from the anesthesia or the breathing tube placed in your throat during surgery. If this causes discomfort, gargle with warm salt water. The discomfort should disappear within 24 hours. ° °If you had a scopolamine patch placed behind your ear for the management of post- operative nausea and/or vomiting: ° °1. The medication in the patch is effective for 72 hours, after which it should be removed.  Wrap patch in a tissue and discard in the trash. Wash hands thoroughly with soap and water. °2. You may remove the patch earlier than 72 hours if you experience unpleasant side effects which may include dry mouth, dizziness or visual disturbances. °3. Avoid touching the patch. Wash your hands with soap and water after contact with the patch. °  ° °

## 2015-07-09 NOTE — Anesthesia Preprocedure Evaluation (Addendum)
Anesthesia Evaluation  Patient identified by MRN, date of birth, ID band Patient awake    Reviewed: Allergy & Precautions, NPO status , Patient's Chart, lab work & pertinent test results  Airway Mallampati: II   Neck ROM: full    Dental   Pulmonary sleep apnea and Continuous Positive Airway Pressure Ventilation , former smoker,    breath sounds clear to auscultation       Cardiovascular hypertension, Pt. on medications  Rhythm:regular Rate:Normal     Neuro/Psych  Headaches, PSYCHIATRIC DISORDERS Anxiety Depression    GI/Hepatic   Endo/Other  diabetes, Type 2, Oral Hypoglycemic AgentsMorbid obesity  Renal/GU      Musculoskeletal  (+) Arthritis ,   Abdominal   Peds  Hematology   Anesthesia Other Findings   Reproductive/Obstetrics                            Anesthesia Physical Anesthesia Plan  ASA: II  Anesthesia Plan: General and Regional   Post-op Pain Management: MAC Combined w/ Regional for Post-op pain   Induction: Intravenous  Airway Management Planned: Oral ETT  Additional Equipment:   Intra-op Plan:   Post-operative Plan: Extubation in OR  Informed Consent: I have reviewed the patients History and Physical, chart, labs and discussed the procedure including the risks, benefits and alternatives for the proposed anesthesia with the patient or authorized representative who has indicated his/her understanding and acceptance.     Plan Discussed with: CRNA, Anesthesiologist and Surgeon  Anesthesia Plan Comments:         Anesthesia Quick Evaluation

## 2015-07-09 NOTE — Brief Op Note (Signed)
07/09/2015  3:12 PM  PATIENT:  Taylor Barnes  60 y.o. female  PRE-OPERATIVE DIAGNOSIS:  RIGHT ACHILLES TENDONITIS; TIGHT RIGHT HEEL CORD; HAGLUND DEFORMITY  POST-OPERATIVE DIAGNOSIS:  RIGHT ACHILLES TENDONITIS; TIGHT RIGHT HEEL CORD; HAGLUND DEFORMITY  Procedure(s): 1.  RIGHT GASTROC RECESSION (separate incision) 2.  Right ACHILLES tendon DEBRIDEMENT AND RECONSTRUCTION;  3.  EXCISION OF right HAGLUND DEFORMITY  SURGEON:  Wylene Simmer, MD  ASSISTANT: n/a  ANESTHESIA:   General, regional  EBL:  minimal   TOURNIQUET:   Total Tourniquet Time Documented: Thigh (Right) - 58 minutes Total: Thigh (Right) - 58 minutes  COMPLICATIONS:  None apparent  DISPOSITION:  Extubated, awake and stable to recovery.  DICTATION ID:  [id number]

## 2015-07-10 ENCOUNTER — Encounter (HOSPITAL_BASED_OUTPATIENT_CLINIC_OR_DEPARTMENT_OTHER): Payer: Self-pay | Admitting: Orthopedic Surgery

## 2015-07-11 DIAGNOSIS — G4733 Obstructive sleep apnea (adult) (pediatric): Secondary | ICD-10-CM | POA: Diagnosis not present

## 2015-07-23 DIAGNOSIS — M7661 Achilles tendinitis, right leg: Secondary | ICD-10-CM | POA: Diagnosis not present

## 2015-07-23 DIAGNOSIS — Z4789 Encounter for other orthopedic aftercare: Secondary | ICD-10-CM | POA: Diagnosis not present

## 2015-08-04 MED FILL — metFORMIN HCL 500 MG TABS: 500 | 90 days supply | Qty: 270 | Fill #1

## 2015-08-05 ENCOUNTER — Other Ambulatory Visit (HOSPITAL_COMMUNITY): Payer: Self-pay | Admitting: Orthopedic Surgery

## 2015-08-05 DIAGNOSIS — M7661 Achilles tendinitis, right leg: Secondary | ICD-10-CM

## 2015-08-11 DIAGNOSIS — G4733 Obstructive sleep apnea (adult) (pediatric): Secondary | ICD-10-CM | POA: Diagnosis not present

## 2015-08-14 DIAGNOSIS — G4733 Obstructive sleep apnea (adult) (pediatric): Secondary | ICD-10-CM | POA: Diagnosis not present

## 2015-08-19 ENCOUNTER — Ambulatory Visit (HOSPITAL_COMMUNITY): Payer: 59

## 2015-08-24 ENCOUNTER — Telehealth: Payer: Self-pay

## 2015-09-01 DIAGNOSIS — G4733 Obstructive sleep apnea (adult) (pediatric): Secondary | ICD-10-CM | POA: Diagnosis not present

## 2015-09-01 DIAGNOSIS — G4700 Insomnia, unspecified: Secondary | ICD-10-CM | POA: Diagnosis not present

## 2015-09-07 MED FILL — LISINOPRIL 5 MG TABLET: 5 | 90 days supply | Qty: 90 | Fill #2

## 2015-09-08 DIAGNOSIS — G4733 Obstructive sleep apnea (adult) (pediatric): Secondary | ICD-10-CM | POA: Diagnosis not present

## 2015-09-08 MED FILL — SERTRALINE HCL 100 MG TAB: 100 | 90 days supply | Qty: 180 | Fill #0

## 2015-09-08 MED FILL — ATORVASTATIN 40 MG TABLET: 40 | 90 days supply | Qty: 90 | Fill #0

## 2015-09-14 DIAGNOSIS — E119 Type 2 diabetes mellitus without complications: Secondary | ICD-10-CM | POA: Diagnosis not present

## 2015-09-14 DIAGNOSIS — G4733 Obstructive sleep apnea (adult) (pediatric): Secondary | ICD-10-CM | POA: Diagnosis not present

## 2015-09-17 ENCOUNTER — Telehealth: Payer: Self-pay

## 2015-10-09 DIAGNOSIS — G4733 Obstructive sleep apnea (adult) (pediatric): Secondary | ICD-10-CM | POA: Diagnosis not present

## 2015-10-13 ENCOUNTER — Telehealth: Payer: Self-pay

## 2015-10-15 DIAGNOSIS — G4733 Obstructive sleep apnea (adult) (pediatric): Secondary | ICD-10-CM | POA: Diagnosis not present

## 2015-10-15 DIAGNOSIS — G4700 Insomnia, unspecified: Secondary | ICD-10-CM | POA: Diagnosis not present

## 2015-11-02 ENCOUNTER — Encounter: Payer: Self-pay | Admitting: Pharmacist

## 2015-11-02 ENCOUNTER — Other Ambulatory Visit: Payer: Self-pay | Admitting: Pharmacist

## 2015-11-02 VITALS — Wt 240.0 lb

## 2015-11-02 DIAGNOSIS — E119 Type 2 diabetes mellitus without complications: Secondary | ICD-10-CM

## 2015-11-02 LAB — POCT GLYCOSYLATED HEMOGLOBIN (HGB A1C): Hemoglobin A1C: 6.3

## 2015-11-02 NOTE — Patient Outreach (Addendum)
[facility]) Care Management  [place]   11/02/2015  Taylor Barnes 28-Dec-1955 [id number]  Subjective: 60 YO female presenting for 3 month diabetes follow-up as part of the employee sponsored link to wellness program. She reports adherence to her current regimen. She reports she had right ankle surgery on 07/09/15 and reports increased weight and limited mobility since her surgery.  Following her surgery she reports multiple falls including one fall where she hit her head.  On this occasion she was examined by her orthopedic MD and was cleared. She reports no falls since her mobility improved in the few weeks post surgery. Her exercise is limited by ankle pain which she currently manages with over the counter Tylenol and Motrin. 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 She reports she is unable to afford rehabilitation services for her ankle but is doing rehabilitation exercises at home. She also reports that she sees orthopedics about every month and her last visit was 09/25/15.   Exercise: Limited following right ankle surgery in 06/2015. Currently no exercise regimen at home.   Reports prior to her ongoing foot problems she was working out at [employer] working out on the bike and treadmill or often taking her dog for walks.   Diet: States is eating better since she moved in with her daughter who makes dinner for her.  She states that she has decreased her fast food intake.  Breakfast: Shredded wheat or Bran cereal with milk  Lunch: Ham sandwich with chips/cheetos and water Dinner: Meat with vegetable (green beans) and starch (macaroni and cheese)  Objective:  11/02/15 HbA1c (POC): 6.3%  Encounter Medications: Outpatient Encounter Prescriptions as of 11/02/2015  Medication Sig  . atorvastatin (LIPITOR) 40 MG tablet Take 40 mg by mouth every morning.   Marland Kitchen  lisinopril (PRINIVIL,ZESTRIL) 5 MG tablet Take 5 mg by mouth every morning.   . meloxicam (MOBIC) 15 MG tablet Take 15 mg by mouth daily.  . metFORMIN (GLUCOPHAGE) 500 MG tablet Take 500-1,000 mg by mouth 2 (two) times daily with a meal. 1 tablet in the morning and 2 in the afternoon  . oxyCODONE (ROXICODONE) 5 MG immediate release tablet Take 1-2 tablets (5-10 mg total) by mouth every 4 (four) hours as needed for moderate pain or severe pain.  Marland Kitchen sertraline (ZOLOFT) 100 MG tablet Take 200 mg by mouth daily.   No facility-administered encounter medications on file as of 11/02/2015.    Functional Status: In your present state of health, do you have any difficulty performing the following activities: 07/09/2015 01/27/2015  Hearing? N N  Vision? N N  Difficulty concentrating or making decisions? N N  Walking or climbing stairs? N N  Dressing or bathing? N N  Doing errands, shopping? - N    Fall/Depression Screening: PHQ 2/9 Scores 01/27/2015  PHQ - 2 Score 1    Assessment:  Diabetes:  Currently at goal A1C of <7% at 6.3% on metformin.  Exercise is currently limited following ankle surgery but diet has improved since moving in with her daughter. Current weight has increase ~10 lbs since her ankle surgery.   Plan: Continue current medications.  Discussed low carbohydrate diet and exercise goal of 30 minutes per day for 5 days a week. Patient has set a personal goal of increasing exercise/walking over the next couple weeks to 30 minutes  per day for 5 days a week as tolerated with her ankle pain.  Will follow-up in 3 months for further review of DM. Plan to schedule follow-up via email. Next follow up with PCP in 06/2016.   THN CM Care Plan Problem One        Most Recent Value   Care Plan Problem One  Increased weight   Role Documenting the Problem One  Clinical Pharmacist   Care Plan for Problem One  Active   THN Long Term Goal (31-90 days)  patient will begin walking everyday over the next 3  months and weight will be checked at next visit   [place] Start Date  01/27/15   Interventions for Problem One Long Term Goal  discussed goal exercise 150 minutes per week   THN CM Short Term Goal #1 (0-30 days)  Increase exercise over the next 3 weeks to a goal of 30 minutes per day for 5 days per week as measured by patient report   THN CM Short Term Goal #1 Start Date  11/02/15   Interventions for Short Term Goal #1  Provided counseling on exercise and strategies to increase exercise when limited by ankle pain.

## 2015-11-08 DIAGNOSIS — G4733 Obstructive sleep apnea (adult) (pediatric): Secondary | ICD-10-CM | POA: Diagnosis not present

## 2015-11-17 MED FILL — metFORMIN HCL 500 MG TABS: 500 | 90 days supply | Qty: 270 | Fill #0

## 2015-11-18 ENCOUNTER — Encounter (HOSPITAL_COMMUNITY): Payer: Self-pay

## 2015-11-18 ENCOUNTER — Emergency Department (HOSPITAL_COMMUNITY): Payer: 59

## 2015-11-18 ENCOUNTER — Emergency Department (HOSPITAL_COMMUNITY)
Admission: EM | Admit: 2015-11-18 | Discharge: 2015-11-19 | Disposition: A | Discharge status: Home / Self Care | Payer: 59 | Attending: Emergency Medicine | Admitting: Emergency Medicine

## 2015-11-18 DIAGNOSIS — M79605 Pain in left leg: Secondary | ICD-10-CM

## 2015-11-18 DIAGNOSIS — E119 Type 2 diabetes mellitus without complications: Secondary | ICD-10-CM | POA: Diagnosis not present

## 2015-11-18 DIAGNOSIS — Z87891 Personal history of nicotine dependence: Secondary | ICD-10-CM | POA: Insufficient documentation

## 2015-11-18 DIAGNOSIS — M545 Low back pain: Secondary | ICD-10-CM | POA: Diagnosis not present

## 2015-11-18 DIAGNOSIS — M5442 Lumbago with sciatica, left side: Secondary | ICD-10-CM | POA: Diagnosis not present

## 2015-11-18 DIAGNOSIS — Z79899 Other long term (current) drug therapy: Secondary | ICD-10-CM | POA: Insufficient documentation

## 2015-11-18 DIAGNOSIS — E785 Hyperlipidemia, unspecified: Secondary | ICD-10-CM | POA: Diagnosis not present

## 2015-11-18 DIAGNOSIS — M19012 Primary osteoarthritis, left shoulder: Secondary | ICD-10-CM | POA: Diagnosis not present

## 2015-11-18 DIAGNOSIS — M25552 Pain in left hip: Secondary | ICD-10-CM | POA: Diagnosis not present

## 2015-11-18 DIAGNOSIS — M19011 Primary osteoarthritis, right shoulder: Secondary | ICD-10-CM | POA: Diagnosis not present

## 2015-11-18 DIAGNOSIS — Z7984 Long term (current) use of oral hypoglycemic drugs: Secondary | ICD-10-CM | POA: Insufficient documentation

## 2015-11-18 DIAGNOSIS — M5432 Sciatica, left side: Secondary | ICD-10-CM

## 2015-11-18 DIAGNOSIS — F329 Major depressive disorder, single episode, unspecified: Secondary | ICD-10-CM | POA: Diagnosis not present

## 2015-11-18 MED ORDER — DEXAMETHASONE SODIUM PHOSPHATE 10 MG/ML IJ SOLN
10.0000 mg | Freq: Once | INTRAMUSCULAR | Status: AC
  Administered 2015-11-18: 10 mg via INTRAMUSCULAR
  Filled 2015-11-18: qty 1

## 2015-11-18 MED ORDER — OXYCODONE-ACETAMINOPHEN 5-325 MG PO TABS
2.0000 | ORAL_TABLET | Freq: Once | ORAL | Status: AC
  Administered 2015-11-18: 2 via ORAL
  Filled 2015-11-18: qty 2

## 2015-11-18 NOTE — ED Provider Notes (Signed)
CSN: [id number]     Arrival date & time 11/18/15  2147 History   First MD Initiated Contact with Patient 11/18/15 2252     Chief Complaint  Patient presents with  . Leg Pain     (Consider location/radiation/quality/duration/timing/severity/associated sxs/prior Treatment) HPI Comments: 60 year old female with a history of arthritis, diabetes mellitus, dyslipidemia, depression and anxiety presents to the emergency department for evaluation of back and leg pain. Patient states that she fell a pain in the back of her left thigh 2 days ago. This pain is aggravated with movement of her left lower extremity. She states that it worsened despite ibuprofen. This evening, the patient states that she heard a "pop" and had acute worsening of her left lower extremity pain. She describes it as constant and aching. She has been unable to ambulate since. Patient reports radiation of her pain down her posterior thigh to her foot. She denies a history of chronic steroid use. She has had no recent fevers. She has no known history of osteopenia or osteoporosis. No bowel or bladder incontinence. No falls or trauma to back or leg inciting symptoms.  Patient is a 60 y.o. female presenting with leg pain. The history is provided by the patient. No language interpreter was used.  Leg Pain Associated symptoms: back pain   Associated symptoms: no fever     Past Medical History  Diagnosis Date  . Headache(784.0)     hx of migraines  . Arthritis     both shoulders  . AC (acromioclavicular) joint bone spurs     both feet  . Complication of anesthesia     trouble waking up  . Diabetes mellitus without complication ([place])     takes lisinopril to protect kidneys  . Depression   . Anxiety   . Hyperlipidemia   . Sleep apnea     CPAP nightly  . Tendonitis, Achilles, right    Past Surgical History  Procedure Laterality Date  . Lung biopsy  age 58  . Bilateral temporomandibular joint arthroplasty  age 26  . Breast  biopsy Right yrs ago  . Carpal tunnel release Bilateral     multiple times both hands  .   c sections      x 2  . Cholecystectomy  yrs ago  . Dilation and curettage of uterus  yrs ago  . Cyst removed from hand  yrs ago  . Ablation      uteres  . Colonoscopy N/A 12/06/2013    Procedure: COLONOSCOPY;  Surgeon: Cleotis Nipper, MD;  Location: [facility];  Service: Endoscopy;  Laterality: N/A;  . Gastrocnemius recession Right 07/09/2015    Procedure: RIGHT GASTROC RECESSION ;  Surgeon: Wylene Simmer, MD;  Location: [facility];  Service: Orthopedics;  Laterality: Right;  . Excision haglund's deformity with achilles tendon repair Right 07/09/2015    Procedure: ACHILLES DEBRIDEMENT AND RECONSTRUCTION; EXCISION OF HAGLUND DEFORMITY;  Surgeon: Wylene Simmer, MD;  Location: [facility];  Service: Orthopedics;  Laterality: Right;   History reviewed. No pertinent family history. Social History  Substance Use Topics  . Smoking status: Former Smoker    Quit date: 12/06/1985  . Smokeless tobacco: Never Used  . Alcohol Use: No   OB History    No data available      Review of Systems  Constitutional: Negative for fever.  Genitourinary:       Negative for bowel/bladder incontinence  Musculoskeletal: Positive for back pain and arthralgias.  Neurological:  Negative for weakness.  All other systems reviewed and are negative.   Allergies  Aspirin  Home Medications   Prior to Admission medications   Medication Sig Start Date End Date Taking? Authorizing Provider  atorvastatin (LIPITOR) 40 MG tablet Take 40 mg by mouth every morning.    Yes Historical Provider, MD  ibuprofen (ADVIL,MOTRIN) 200 MG tablet Take 600 mg by mouth every 6 (six) hours as needed for moderate pain.    Yes Historical Provider, MD  lisinopril (PRINIVIL,ZESTRIL) 5 MG tablet Take 5 mg by mouth every morning.    Yes Historical Provider, MD  metFORMIN (GLUCOPHAGE) 500 MG tablet Take 500-1,000 mg by  mouth 2 (two) times daily with a meal. 1 tablet in the morning and 2 in the afternoon   Yes Historical Provider, MD  sertraline (ZOLOFT) 100 MG tablet Take 200 mg by mouth daily.   Yes Historical Provider, MD   BP 112/54 mmHg  Pulse 71  Temp(Src) 98.1 F (36.7 C) (Oral)  Resp 18  SpO2 94%   Physical Exam  Constitutional: She is oriented to person, place, and time. She appears well-developed and well-nourished. No distress.  Nontoxic appearing  HENT:  Head: Normocephalic and atraumatic.  Eyes: Conjunctivae and EOM are normal. No scleral icterus.  Neck: Normal range of motion.  Cardiovascular: Normal rate, regular rhythm and intact distal pulses.   DP and PT pulses 2+ in the LLE  Pulmonary/Chest: Effort normal. No respiratory distress. She has no wheezes.  Respirations even and unlabored  Musculoskeletal: Normal range of motion.       Back:  TTP to the left lumbar paraspinal muscles. No TTP to the lumbosacral midline; no bony deformities, step offs, or crepitus. No TTP to the left hip. Normal ROM of the left hip and knee. No TTP to the thigh or LLE. No bony deformity or crepitus noted to the LLE.  Neurological: She is alert and oriented to person, place, and time. She exhibits normal muscle tone. Coordination normal.  Sensation to light touch intact in the LLE. Patient able to wiggle all toes. Patient with intact reflexes.  Skin: Skin is warm and dry. No rash noted. She is not diaphoretic. No erythema. No pallor.  Psychiatric: She has a normal mood and affect. Her behavior is normal.  Nursing note and vitals reviewed.   ED Course  Procedures (including critical care time) Labs Review Labs Reviewed  CBC WITH DIFFERENTIAL/PLATELET - Abnormal; Notable for the following:    Hemoglobin 11.6 (*)    All other components within normal limits  BASIC METABOLIC PANEL - Abnormal; Notable for the following:    Glucose, Bld 125 (*)    BUN 23 (*)    All other components within normal limits    PROTIME-INR  I-STAT CREATININE, ED    Imaging Review Dg Lumbar Spine Complete  11/18/2015  CLINICAL DATA:  Lumbosacral back pain radiating down left leg. Pain for 1 week. EXAM: LUMBAR SPINE - COMPLETE 4+ VIEW COMPARISON:  None. FINDINGS: The alignment is maintained. Vertebral body heights are normal. There is no listhesis. The posterior elements are intact. Mild disc space narrowing at L3-L4 with minimal endplate spurs. Moderate facet arthropathy at L4-L5 and L5-S1. Degenerative change in the lower thoracic spine is partially included. No fracture. Sacroiliac joints are symmetric with minimal spurring. IMPRESSION: Mild degenerative disc disease and moderate facet arthropathy in the lumbar spine. No acute bony abnormality. Electronically Signed   By: Jeb Levering M.D.   On: 11/18/2015 23:31  Ct Angio Low Extrem Left W/cm &/or Wo/cm  11/19/2015  CLINICAL DATA:  Left leg pain. Difficulty walking. Difficult defined dorsalis pedis. EXAM: CT ANGIOGRAPHY OF THE LEFT LOWEREXTREMITY TECHNIQUE: Multidetector CT imaging of the left lower extremity was performed using the standard protocol during bolus administration of intravenous contrast. Multiplanar CT image reconstructions and MIPs were obtained to evaluate the vascular anatomy. CONTRAST:  100 cc Isovue 370 IV COMPARISON:  None. FINDINGS: Normal opacification of the left external iliac, common femoral, superficial femoral, profunda femoral, and popliteal arteries. No significant atherosclerosis. No abrupt occlusion. No luminal narrowing or aneurysm. Distal popliteal artery is normal, there is three-vessel runoff to the level of the distal lower leg. Evaluation of the ankle and foot is limited due to habitus and phase of contrast, no abrupt occlusion is seen. No evidence of focal fluid collection, focal muscular abnormality, or abnormal soft tissue air. No acute osseous abnormality is seen. Mild degenerative change of the hip and knee. There is a small knee  joint effusion. No focal bone lesion. Review of the MIP images confirms the above findings. IMPRESSION: Left lower extremity CTA demonstrating three-vessel runoff to the level of the distal lower leg. No significant atherosclerosis or acute vascular abnormality. Electronically Signed   By: Jeb Levering M.D.   On: 11/19/2015 03:04   Dg Hip Unilat With Pelvis 2-3 Views Left  11/18/2015  CLINICAL DATA:  Lumbosacral back pain radiating down left leg. Pain for 1 week. EXAM: DG HIP (WITH OR WITHOUT PELVIS) 2-3V LEFT COMPARISON:  None. FINDINGS: The cortical margins of the bony pelvis and left hip are intact. No fracture. Pubic symphysis and sacroiliac joints are congruent. Both femoral heads are well-seated in the respective acetabula. 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 Minimal and the is a pathic change at the greater trochanter. IMPRESSION: Mild degenerative change of the left hip. No acute osseous abnormality. Electronically Signed   By: Jeb Levering M.D.   On: 11/18/2015 23:30     I have personally reviewed and evaluated these images and lab results as part of my medical decision-making.   EKG Interpretation None      MDM   Final diagnoses:  Left leg pain  Sciatica of left side    60 year old female presents to the emergency department for evaluation of left leg pain. She is neurovascularly intact on my exam. During my attending's evaluation, there was question of decreased distal pulses. Patient with preserved range of motion of her left lower extremity. She has no red flags or signs concerning for cauda equina. Initial x-rays of low back and hip were negative. Case discussed with my attending, Dr. Randal Buba, who recommended CTA of LLE to assess for vascular injury. This was found to be negative today.  Pain adequately controlled in the emergency department with Decadron, Percocet, and Dilaudid. Suspect that symptoms are secondary to sciatica. Will  refer to orthopedics for repeat evaluation. No indication for further emergent workup at this time. Patient discharged in satisfactory condition with no unaddressed concerns.   Filed Vitals:   11/19/15 0000 11/19/15 0013 11/19/15 0100 11/19/15 0244  BP: 148/47 125/46 114/63 112/54  Pulse: 67 68 78 71  Temp:      TempSrc:      Resp:  18  18  SpO2: 97% 97% 97% 94%     Antonietta Breach, PA-C 11/19/15 [id number]  April Palumbo, MD 11/19/15 [phone]

## 2015-11-18 NOTE — ED Notes (Signed)
Pt in X-Ray ?

## 2015-11-18 NOTE — ED Notes (Signed)
Pt complains of left leg pain since Monday, today she heard a pop and now she can't put any weight on that leg

## 2015-11-19 ENCOUNTER — Emergency Department (HOSPITAL_COMMUNITY): Payer: 59

## 2015-11-19 ENCOUNTER — Encounter (HOSPITAL_COMMUNITY): Payer: Self-pay

## 2015-11-19 DIAGNOSIS — Z87891 Personal history of nicotine dependence: Secondary | ICD-10-CM | POA: Diagnosis not present

## 2015-11-19 DIAGNOSIS — E119 Type 2 diabetes mellitus without complications: Secondary | ICD-10-CM | POA: Diagnosis not present

## 2015-11-19 DIAGNOSIS — F329 Major depressive disorder, single episode, unspecified: Secondary | ICD-10-CM | POA: Diagnosis not present

## 2015-11-19 DIAGNOSIS — M5442 Lumbago with sciatica, left side: Secondary | ICD-10-CM | POA: Diagnosis not present

## 2015-11-19 DIAGNOSIS — M19012 Primary osteoarthritis, left shoulder: Secondary | ICD-10-CM | POA: Diagnosis not present

## 2015-11-19 DIAGNOSIS — M19011 Primary osteoarthritis, right shoulder: Secondary | ICD-10-CM | POA: Diagnosis not present

## 2015-11-19 DIAGNOSIS — E785 Hyperlipidemia, unspecified: Secondary | ICD-10-CM | POA: Diagnosis not present

## 2015-11-19 DIAGNOSIS — Z7984 Long term (current) use of oral hypoglycemic drugs: Secondary | ICD-10-CM | POA: Diagnosis not present

## 2015-11-19 DIAGNOSIS — Z79899 Other long term (current) drug therapy: Secondary | ICD-10-CM | POA: Diagnosis not present

## 2015-11-19 DIAGNOSIS — M79605 Pain in left leg: Secondary | ICD-10-CM | POA: Diagnosis not present

## 2015-11-19 LAB — BASIC METABOLIC PANEL
Anion gap: 7 (ref 5–15)
BUN: 23 mg/dL — ABNORMAL HIGH (ref 6–20)
CO2: 22 mmol/L (ref 22–32)
Calcium: 9.1 mg/dL (ref 8.9–10.3)
Chloride: 108 mmol/L (ref 101–111)
Creatinine, Ser: 0.91 mg/dL (ref 0.44–1.00)
GFR calc Af Amer: >60 mL/min (ref >60)
GFR calc non Af Amer: >60 mL/min (ref >60)
Glucose, Bld: 125 mg/dL — ABNORMAL HIGH (ref 65–99)
Potassium: 4.4 mmol/L (ref 3.5–5.1)
Sodium: 137 mmol/L (ref 135–145)

## 2015-11-19 LAB — CBC WITH DIFFERENTIAL/PLATELET
Basophils Absolute: 0 10*3/uL (ref 0.0–0.1)
Basophils Relative: 0 %
Eosinophils Absolute: 0.2 10*3/uL (ref 0.0–0.7)
Eosinophils Relative: 2 %
HCT: 36.5 % (ref 36.0–46.0)
Hemoglobin: 11.6 g/dL — ABNORMAL LOW (ref 12.0–15.0)
Lymphocytes Relative: 16 %
Lymphs Abs: 1.1 10*3/uL (ref 0.7–4.0)
MCH: 27 pg (ref 26.0–34.0)
MCHC: 31.8 g/dL (ref 30.0–36.0)
MCV: 84.9 fL (ref 78.0–100.0)
Monocytes Absolute: 0.3 10*3/uL (ref 0.1–1.0)
Monocytes Relative: 4 %
Neutro Abs: 5.6 10*3/uL (ref 1.7–7.7)
Neutrophils Relative %: 78 %
Platelets: 248 10*3/uL (ref 150–400)
RBC: 4.3 MIL/uL (ref 3.87–5.11)
RDW: 14.9 % (ref 11.5–15.5)
WBC: 7.2 10*3/uL (ref 4.0–10.5)

## 2015-11-19 LAB — PROTIME-INR
INR: 1.04 (ref 0.00–1.49)
Prothrombin Time: 13.4 seconds (ref 11.6–15.2)

## 2015-11-19 LAB — I-STAT CREATININE, ED: Creatinine, Ser: 0.8 mg/dL (ref 0.44–1.00)

## 2015-11-19 MED ORDER — METHOCARBAMOL 500 MG PO TABS: 500.0000 mg | ORAL_TABLET | Freq: Two times a day (BID) | ORAL | Status: DC | PRN

## 2015-11-19 MED ORDER — KETOROLAC TROMETHAMINE 30 MG/ML IJ SOLN: 30.0000 mg | Freq: Once | INTRAMUSCULAR | Status: DC

## 2015-11-19 MED ORDER — HYDROMORPHONE HCL 1 MG/ML IJ SOLN
1.0000 mg | Freq: Once | INTRAMUSCULAR | Status: AC
  Administered 2015-11-19: 1 mg via INTRAVENOUS
  Filled 2015-11-19: qty 1

## 2015-11-19 MED ORDER — NAPROXEN 500 MG PO TABS
500.0000 mg | ORAL_TABLET | Freq: Two times a day (BID) | ORAL | Status: DC
Start: 2015-11-19 — End: 2016-03-30

## 2015-11-19 MED ORDER — IOPAMIDOL (ISOVUE-370) INJECTION 76%
100.0000 mL | Freq: Once | INTRAVENOUS | Status: AC | PRN
  Administered 2015-11-19: 100 mL via INTRAVENOUS

## 2015-11-19 MED ORDER — KETOROLAC TROMETHAMINE 60 MG/2ML IM SOLN
60.0000 mg | Freq: Once | INTRAMUSCULAR | Status: AC
  Administered 2015-11-19: 60 mg via INTRAMUSCULAR
  Filled 2015-11-19: qty 2

## 2015-11-19 MED ORDER — OXYCODONE-ACETAMINOPHEN 5-325 MG PO TABS: 1.0000 | ORAL_TABLET | Freq: Four times a day (QID) | ORAL | Status: DC | PRN

## 2015-11-19 MED FILL — METHOCARBAMOL 500 MG TABLET: 500 | 10 days supply | Qty: 20 | Fill #0

## 2015-11-19 MED FILL — NAPROXEN 500 MG TABLET: 500 | 15 days supply | Qty: 30 | Fill #0

## 2015-11-19 MED FILL — OXYCODONE/APAP 5/325 MG TAB: 5-325 | 2 days supply | Qty: 15 | Fill #0

## 2015-11-19 NOTE — ED Notes (Signed)
Pt returned from CT °

## 2015-11-19 NOTE — Discharge Instructions (Signed)

## 2015-11-19 NOTE — ED Notes (Signed)
Pt transported to CT ?

## 2015-11-19 NOTE — ED Notes (Signed)
MD at bedside. 

## 2015-11-19 NOTE — ED Provider Notes (Signed)
60 yo F with sciatica.  Having severe pain.  Given prescription for norco but there was no signature.  Patient still having significant pain, family at triage, will write paper script of same previous prescribed but not signed prescription. Family understands follow up.   Deno Etienne, DO 11/19/15 [phone]

## 2015-11-19 NOTE — ED Notes (Signed)
Attempted to ambulate pt with two-person assistance; pt was able to stand and but struggled with shifting weight onto left leg; pt was only able to advance about 3 steps before becoming weak

## 2015-11-20 DIAGNOSIS — M519 Unspecified thoracic, thoracolumbar and lumbosacral intervertebral disc disorder: Secondary | ICD-10-CM | POA: Diagnosis not present

## 2015-11-20 MED FILL — predniSONE 10 MG (21) TBPK: 10 | 6 days supply | Qty: 21 | Fill #0

## 2015-11-20 MED FILL — OXYCODONE-APAP 10-325 TAB: 10-325 | 6 days supply | Qty: 40 | Fill #0

## 2015-11-24 DIAGNOSIS — M5136 Other intervertebral disc degeneration, lumbar region: Secondary | ICD-10-CM | POA: Diagnosis not present

## 2015-12-02 MED FILL — SERTRALINE HCL 100 MG TAB: 100 | 90 days supply | Qty: 180 | Fill #1

## 2015-12-02 MED FILL — ATORVASTATIN 40 MG TABLET: 40 | 90 days supply | Qty: 90 | Fill #1

## 2015-12-15 MED FILL — LISINOPRIL 5 MG TABLET: 5 | 90 days supply | Qty: 90 | Fill #0

## 2015-12-30 MED FILL — traMADol HCL 50 MG TABS: 50 | 13 days supply | Qty: 40 | Fill #0

## 2016-02-01 ENCOUNTER — Ambulatory Visit: Payer: Self-pay

## 2016-02-11 MED FILL — metFORMIN HCL 500 MG TABS: 500 | 90 days supply | Qty: 270 | Fill #1

## 2016-02-26 MED FILL — SERTRALINE HCL 100 MG TAB: 100 | 90 days supply | Qty: 180 | Fill #2

## 2016-02-26 MED FILL — ATORVASTATIN 40 MG TABLET: 40 | 90 days supply | Qty: 90 | Fill #2

## 2016-03-08 MED FILL — LISINOPRIL 5 MG TABLET: 5 | 90 days supply | Qty: 90 | Fill #1

## 2016-03-11 DIAGNOSIS — G4733 Obstructive sleep apnea (adult) (pediatric): Secondary | ICD-10-CM | POA: Diagnosis not present

## 2016-03-30 ENCOUNTER — Other Ambulatory Visit: Payer: Self-pay | Admitting: Pharmacist

## 2016-03-30 ENCOUNTER — Encounter: Payer: Self-pay | Admitting: Pharmacist

## 2016-04-19 ENCOUNTER — Other Ambulatory Visit: Payer: Self-pay | Admitting: Family Medicine

## 2016-04-19 DIAGNOSIS — Z1231 Encounter for screening mammogram for malignant neoplasm of breast: Secondary | ICD-10-CM

## 2016-04-19 NOTE — Patient Outreach (Signed)
[facility]) Care Management  [place]   03/30/2016  [place] Stade Melnik 1955/10/24 [id number]  Subjective: Patient presents today for 3 month diabetes follow-up as part of the employer-sponsored Link to Wellness program.  Current diabetes regimen includes metformin 500 mg every morning and 1000 mg in the evening.  Patient also continues on lisinopril and atorvastatin.  Most recent MD follow-up was 06/24/15 with Dr Hulan Fess.  Patient reports no recent medication changes. She does report left leg pain and has been referred to [facility] but she cannot afford the MRI.  She states she has discussed this with the [facility] department but has not received financial assistance.  She also states her aspirin allergy was nausea/vomiting with higher doses of aspirin but she has not been prescribed aspirin 81 mg daily.  Patient reported dietary habits: Eats 3 meals/day Breakfast: cereal (cornflakes or cheerios) Lunch:canned soup + half sandwich or hamburger with wheat bread Dinner:Chicken tenders, green beans, fried potatoes Drinks:Water, sometimes carbonated flavored water  She reports she is watching her portion sizes.   Patient reported exercise habits: Walks once per day to her mailbox which takes her 5-10 minutes.  Her exercise is limited by her leg pain. She is doing stretching at home to help with the leg pain.   Patient denies hypoglycemic events.  Patient reports nocturia 1 time per night.  Patient denies pain/burning upon urination.  Patient denies neuropathy. Patient denies visual changes. Patient reports self foot exams. Denies changes with her feet.  Patient reported self monitored blood glucose frequency: 0.  She is not currently checking her blood glucose.    Objective:  Lab Results  Component Value Date   HGBA1C 6.3 11/02/2015   Vitals:   03/30/16 1309  BP: 117/72  Pulse: 75   Encounter Medications: Outpatient Encounter  Prescriptions as of 03/30/2016  Medication Sig  . atorvastatin (LIPITOR) 40 MG tablet Take 40 mg by mouth every morning.   Marland Kitchen ibuprofen (ADVIL,MOTRIN) 200 MG tablet Take 600 mg by mouth every 6 (six) hours as needed for moderate pain.   Marland Kitchen lisinopril (PRINIVIL,ZESTRIL) 5 MG tablet Take 5 mg by mouth every morning.   . metFORMIN (GLUCOPHAGE) 500 MG tablet Take 500-1,000 mg by mouth 2 (two) times daily with a meal. 1 tablet in the morning and 2 in the afternoon  . sertraline (ZOLOFT) 100 MG tablet Take 200 mg by mouth daily.  . [DISCONTINUED] methocarbamol (ROBAXIN) 500 MG tablet Take 1 tablet (500 mg total) by mouth 2 (two) times daily as needed for muscle spasms.  . [DISCONTINUED] naproxen (NAPROSYN) 500 MG tablet Take 1 tablet (500 mg total) by mouth 2 (two) times daily.  . [DISCONTINUED] oxyCODONE-acetaminophen (PERCOCET/ROXICET) 5-325 MG tablet Take 1-2 tablets by mouth every 6 (six) hours as needed for severe pain.   No facility-administered encounter medications on file as of 03/30/2016.     Functional Status: In your present state of health, do you have any difficulty performing the following activities: 03/30/2016 11/02/2015  Hearing? N Y  Vision? N N  Difficulty concentrating or making decisions? N N  Walking or climbing stairs? N Y  Dressing or bathing? N N  Doing errands, shopping? N N  Some recent data might be hidden    Fall/Depression Screening: PHQ 2/9 Scores 03/30/2016 11/02/2015 01/27/2015  PHQ - 2 Score 1 1 1      Assessment:  Diabetes: Most recent A1C was 6.3% which is at goal of less than 7%. Weight  is decreased from last visit with me. She is not currently on aspirin due to nausea/vomiting with high doses of aspirin.  She is using ibuprofen 2-3 times per day which is a drug interaction with her ACE inhibitor.   Lifestyle improvements:  Physical Activity- Limited by her leg pain which she treats with ibuprofen, heat/ice therapy and stretching.   Nutrition- Is  watching her portion sizes but is not on a low carbohydrate diet   Plan/Goals for Next Visit: Counseled on low carbohydrate diet and provided patient with handout on plate method and low carbohydrate food choices.  Counseled on increasing exercise as tolerated with her leg pain.  Discussed using mobility exercises and utilizing ice/heat therapy.  Will contact her primary care physician Dr Hulan Fess to recommend a low dose aspirin and a trial of topical diclofenac to reduce the amount of ibuprofen patient is using.  Have referred patient to [place] through the billing department to provide financial assistance with her MRI payment and medical bills.   Next appointment to see me is: 3 months via telephone  [name], PharmD [facility] PGY2 Pharmacy Resident [phone]  [facility] CM Care Plan Problem One   Flowsheet Row Most Recent Value  Care Plan Problem One  Increased weight  Role Documenting the Problem One  Clinical Pharmacist  Care Plan for Problem One  Active  [facility] Long Term Goal (31-90 days)  patient will begin walking everyday over the next 3 months and weight will be checked at next visit  [place] Start Date  01/27/15  Interventions for Problem One Long Term Goal  discussed goal exercise 150 minutes per week.  Encouraged patient to continue stretching at home to increase her mobility and to walk as much as she can tolerate with the pain per her MD recommendations

## 2016-05-05 MED FILL — metFORMIN HCL 500 MG TABS: 500 | 90 days supply | Qty: 270 | Fill #0

## 2016-05-26 ENCOUNTER — Ambulatory Visit
Admission: RE | Admit: 2016-05-26 | Discharge: 2016-05-26 | Disposition: A | Discharge status: Home / Self Care | Payer: 59 | Source: Ambulatory Visit | Attending: Family Medicine | Admitting: Family Medicine

## 2016-05-26 DIAGNOSIS — Z1231 Encounter for screening mammogram for malignant neoplasm of breast: Secondary | ICD-10-CM | POA: Diagnosis not present

## 2016-05-31 DIAGNOSIS — J3489 Other specified disorders of nose and nasal sinuses: Secondary | ICD-10-CM | POA: Diagnosis not present

## 2016-05-31 DIAGNOSIS — L600 Ingrowing nail: Secondary | ICD-10-CM | POA: Diagnosis not present

## 2016-05-31 MED FILL — LISINOPRIL 5 MG TABLET: 5 | 90 days supply | Qty: 90 | Fill #2

## 2016-05-31 MED FILL — MUPIROCIN 2% OINTMENT: 2 | 5 days supply | Qty: 22 | Fill #0

## 2016-06-02 ENCOUNTER — Ambulatory Visit (INDEPENDENT_AMBULATORY_CARE_PROVIDER_SITE_OTHER): Payer: 59 | Admitting: Podiatry

## 2016-06-02 ENCOUNTER — Encounter: Payer: Self-pay | Admitting: Podiatry

## 2016-06-02 VITALS — BP 104/60 | HR 75 | Resp 16

## 2016-06-02 DIAGNOSIS — L03031 Cellulitis of right toe: Secondary | ICD-10-CM | POA: Diagnosis not present

## 2016-06-02 MED ORDER — CEPHALEXIN 500 MG PO CAPS: 500.0000 mg | ORAL_CAPSULE | Freq: Two times a day (BID) | ORAL | 0 refills | Status: DC

## 2016-06-02 MED FILL — CEPHALEXIN 500 MG CAPSULE: 500 | 10 days supply | Qty: 20 | Fill #0

## 2016-06-02 NOTE — Progress Notes (Signed)
   Subjective:    Patient ID: Taylor Barnes, female    DOB: Mar 28, 1956, 60 y.o.   MRN: [id number]  HPI    Review of Systems  All other systems reviewed and are negative.      Objective:   Physical Exam GENERAL APPEARANCE: Alert, conversant. Appropriately groomed. No acute distress.  VASCULAR: Pedal pulses are  palpable at  [facility] and PT bilateral.  Capillary refill time is immediate to all digits,  Normal temperature gradient.  Digital hair growth is present bilateral  NEUROLOGIC: sensation is normal to 5.07 monofilament at 5/5 sites bilateral.  Light touch is intact bilateral, Muscle strength normal.  MUSCULOSKELETAL: acceptable muscle strength, tone and stability bilateral.  Intrinsic muscluature intact bilateral.  Rectus appearance of foot and digits noted bilateral.   DERMATOLOGIC: skin color, texture, and turgor are within normal limits.  No preulcerative lesions or ulcers  are seen, no interdigital maceration noted.  No open lesions present.  No drainage noted.  NAILS  There are blackened asymptomatic areas under hallux nail plate.  No pain or infection noted.  There is redness and swelling lateral border right great toe.  There is granulation and desquamation along lateral border.          Assessment & Plan:  Paronychia lateral border second toe left foot.  IE  Debride offending fragment.  Prescribe cephalexin.  Told to continue home soaks.  If the problem worsens call me.  RTC prn.   Gardiner Barefoot DPM

## 2016-06-15 MED FILL — SERTRALINE HCL 100 MG TAB: 100 | 90 days supply | Qty: 180 | Fill #0

## 2016-06-15 MED FILL — ATORVASTATIN 40 MG TABLET: 40 | 90 days supply | Qty: 90 | Fill #0

## 2016-07-15 ENCOUNTER — Other Ambulatory Visit: Payer: Self-pay | Admitting: Pharmacist

## 2016-07-15 NOTE — Patient Outreach (Signed)
[facility]) Care Management  07/15/2016  Taylor Barnes [birth year] [id number]  Patient had visit scheduled for today for employee sponsored Link to Wellness diabetes program.  Patient has been transitioned to [employer].  Patient will continue to be followed with new [employer] diabetes program.  [name], PharmD, [place] PGY2 Pharmacy Resident [phone]

## 2016-08-01 MED FILL — metFORMIN HCL 500 MG TABS: 500 | 90 days supply | Qty: 270 | Fill #1

## 2016-08-03 ENCOUNTER — Other Ambulatory Visit: Payer: Self-pay | Admitting: Family Medicine

## 2016-08-03 ENCOUNTER — Other Ambulatory Visit (HOSPITAL_COMMUNITY)
Admission: RE | Admit: 2016-08-03 | Discharge: 2016-08-03 | Disposition: A | Discharge status: Home / Self Care | Payer: 59 | Source: Ambulatory Visit | Attending: Family Medicine | Admitting: Family Medicine

## 2016-08-03 DIAGNOSIS — Z0000 Encounter for general adult medical examination without abnormal findings: Secondary | ICD-10-CM | POA: Diagnosis not present

## 2016-08-03 DIAGNOSIS — E782 Mixed hyperlipidemia: Secondary | ICD-10-CM | POA: Diagnosis not present

## 2016-08-03 DIAGNOSIS — G4730 Sleep apnea, unspecified: Secondary | ICD-10-CM | POA: Diagnosis not present

## 2016-08-03 DIAGNOSIS — Z1151 Encounter for screening for human papillomavirus (HPV): Secondary | ICD-10-CM | POA: Diagnosis not present

## 2016-08-03 DIAGNOSIS — Z23 Encounter for immunization: Secondary | ICD-10-CM | POA: Diagnosis not present

## 2016-08-03 DIAGNOSIS — Z86010 Personal history of colonic polyps: Secondary | ICD-10-CM | POA: Diagnosis not present

## 2016-08-03 DIAGNOSIS — F338 Other recurrent depressive disorders: Secondary | ICD-10-CM | POA: Diagnosis not present

## 2016-08-03 DIAGNOSIS — I10 Essential (primary) hypertension: Secondary | ICD-10-CM | POA: Diagnosis not present

## 2016-08-03 DIAGNOSIS — Z124 Encounter for screening for malignant neoplasm of cervix: Secondary | ICD-10-CM | POA: Diagnosis not present

## 2016-08-03 DIAGNOSIS — R8290 Unspecified abnormal findings in urine: Secondary | ICD-10-CM | POA: Diagnosis not present

## 2016-08-03 DIAGNOSIS — E119 Type 2 diabetes mellitus without complications: Secondary | ICD-10-CM | POA: Diagnosis not present

## 2016-08-03 DIAGNOSIS — K760 Fatty (change of) liver, not elsewhere classified: Secondary | ICD-10-CM | POA: Diagnosis not present

## 2016-08-04 LAB — CYTOLOGY - PAP
Diagnosis: NEGATIVE
HPV: NOT DETECTED

## 2016-09-02 MED FILL — LISINOPRIL 5 MG TABLET: 5 | 90 days supply | Qty: 90 | Fill #0

## 2016-09-12 MED FILL — ATORVASTATIN 40 MG TABLET: 40 | 90 days supply | Qty: 90 | Fill #0

## 2016-09-14 DIAGNOSIS — E119 Type 2 diabetes mellitus without complications: Secondary | ICD-10-CM | POA: Diagnosis not present

## 2016-09-26 MED FILL — SERTRALINE HCL 100 MG TAB: 100 | 90 days supply | Qty: 180 | Fill #0

## 2016-10-17 ENCOUNTER — Ambulatory Visit: Payer: Self-pay

## 2016-10-17 ENCOUNTER — Other Ambulatory Visit: Payer: Self-pay | Admitting: Occupational Medicine

## 2016-10-17 DIAGNOSIS — M533 Sacrococcygeal disorders, not elsewhere classified: Secondary | ICD-10-CM

## 2016-11-21 MED FILL — metFORMIN HCL 500 MG TABS: 500 | 90 days supply | Qty: 270 | Fill #0

## 2016-12-05 MED FILL — LISINOPRIL 5 MG TABLET: 5 | 90 days supply | Qty: 90 | Fill #0

## 2016-12-07 MED FILL — ATORVASTATIN 40 MG TABLET: 40 | 90 days supply | Qty: 90 | Fill #1

## 2016-12-22 MED FILL — SERTRALINE HCL 100 MG TAB: 100 | 90 days supply | Qty: 180 | Fill #0

## 2017-01-07 IMAGING — CT CT ANGIO EXTREM LOW*L*
1 of 5 series · 12 of 33 positions shown · IV contrast (ISOVUE 370)
Comparison: None.

CLINICAL DATA: Left leg pain. Difficulty walking. Difficult defined
dorsalis pedis.

EXAM:
CT ANGIOGRAPHY OF THE LEFT LOWEREXTREMITY
TECHNIQUE: Multidetector CT imaging of the left lower extremity was performed
using the standard protocol during bolus administration of
intravenous contrast. Multiplanar CT image reconstructions and MIPs
were obtained to evaluate the vascular anatomy.
CONTRAST:  100 cc Isovue 370 IV

[Series 4: arterial · axial · arterial · 0.54mm/px · z∈[+435,+1293]mm · 12 of 338 slices shown]
[im 26/338  soft-tissue]
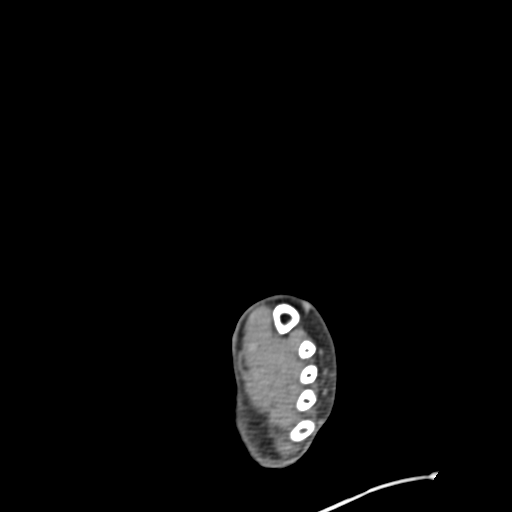
[im 52/338  bone]
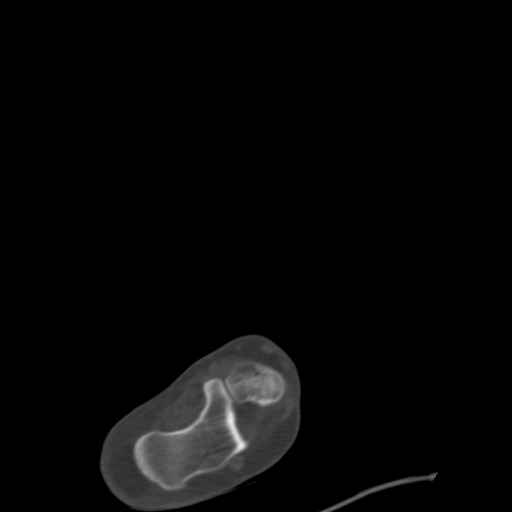
[im 78/338  soft-tissue]
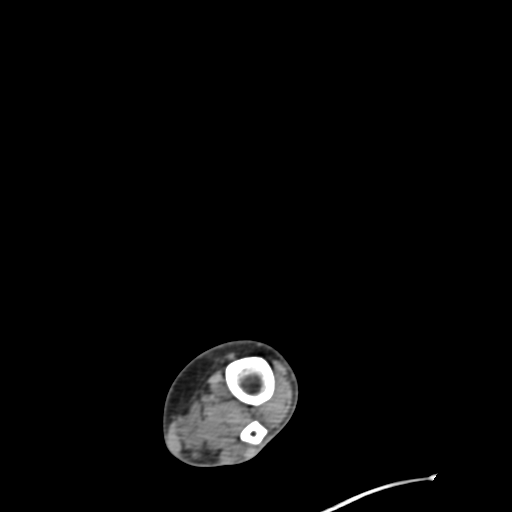
[im 104/338  bone]
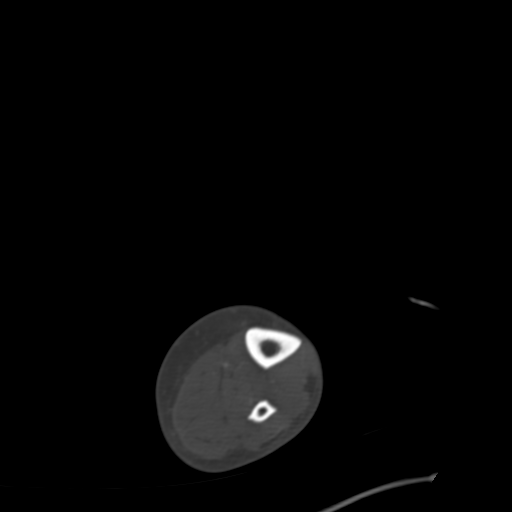
[im 130/338  soft-tissue]
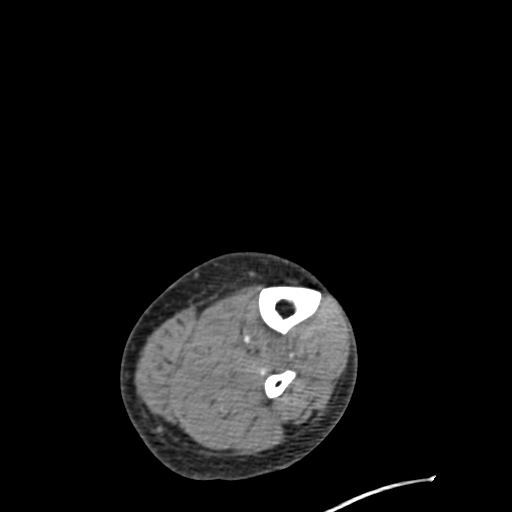
[im 156/338  bone]
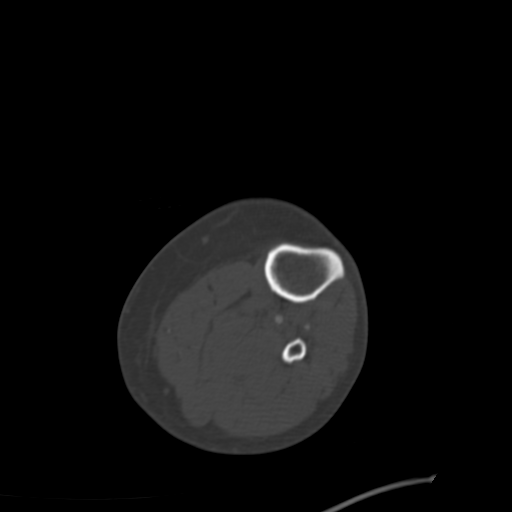
[im 182/338  soft-tissue]
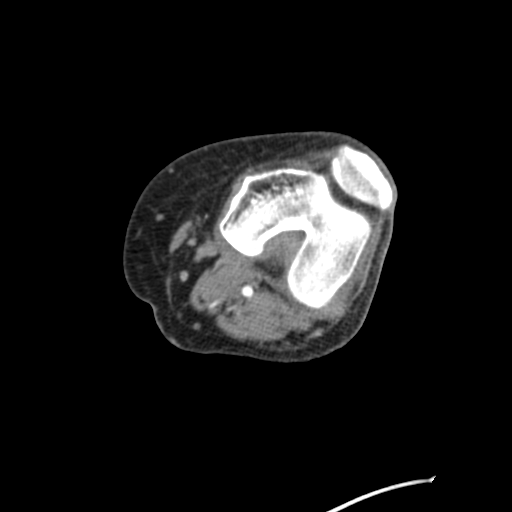
[im 208/338  bone]
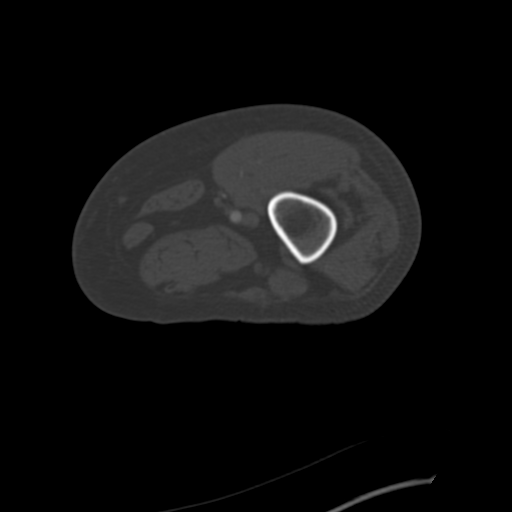
[im 234/338  soft-tissue]
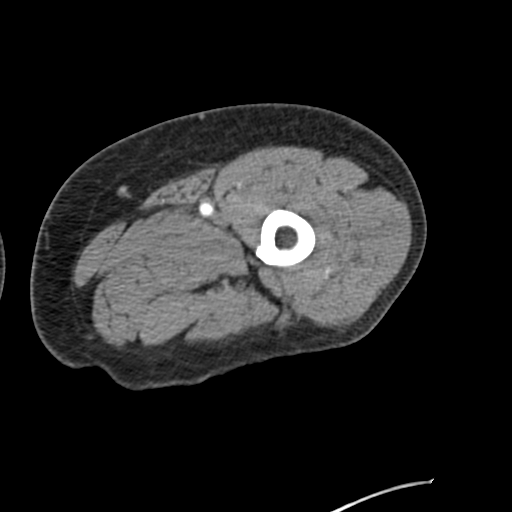
[im 260/338  bone]
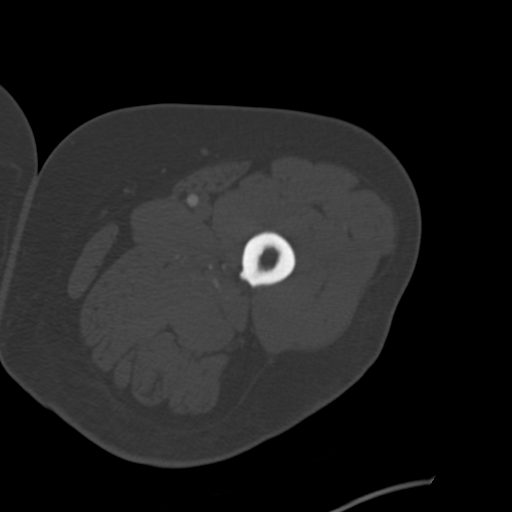
[im 286/338  soft-tissue]
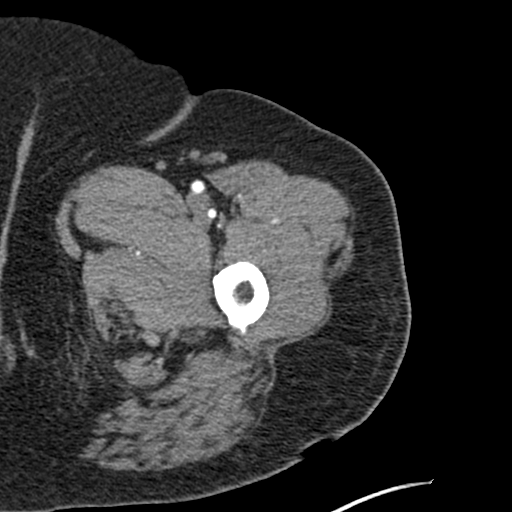
[im 312/338  bone]
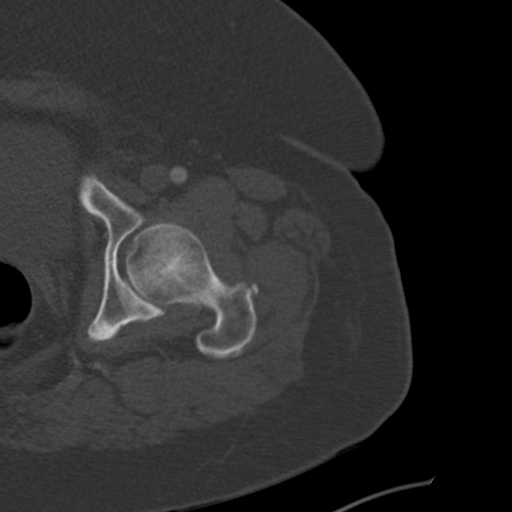

[12 of 33 positions shown; findings below may reference images not displayed]

FINDINGS: Normal opacification of the left external iliac, common femoral,
superficial femoral, profunda femoral, and popliteal arteries. No
significant atherosclerosis. No abrupt occlusion. No luminal
narrowing or aneurysm.

Distal popliteal artery is normal, there is three-vessel runoff to
the level of the distal lower leg. Evaluation of the ankle and foot
is limited due to habitus and phase of contrast, no abrupt occlusion
is seen.

No evidence of focal fluid collection, focal muscular abnormality,
or abnormal soft tissue air.

No acute osseous abnormality is seen. Mild degenerative change of
the hip and knee. There is a small knee joint effusion. No focal
bone lesion.

Review of the MIP images confirms the above findings.
IMPRESSION: Left lower extremity CTA demonstrating three-vessel runoff to the
level of the distal lower leg. No significant atherosclerosis or
acute vascular abnormality.

## 2017-02-13 MED FILL — metFORMIN HCL 500 MG TABS: 500 | 90 days supply | Qty: 270 | Fill #1

## 2017-02-27 MED FILL — LISINOPRIL 5 MG TABLET: 5 | 90 days supply | Qty: 90 | Fill #1

## 2017-03-07 MED FILL — ATORVASTATIN 40 MG TABLET: 40 | 90 days supply | Qty: 90 | Fill #0

## 2017-03-17 MED FILL — SERTRALINE HCL 100 MG TAB: 100 | 90 days supply | Qty: 180 | Fill #1

## 2017-04-20 ENCOUNTER — Other Ambulatory Visit: Payer: Self-pay | Admitting: Family Medicine

## 2017-04-20 DIAGNOSIS — Z1231 Encounter for screening mammogram for malignant neoplasm of breast: Secondary | ICD-10-CM

## 2017-05-15 DIAGNOSIS — K529 Noninfective gastroenteritis and colitis, unspecified: Secondary | ICD-10-CM | POA: Diagnosis not present

## 2017-05-15 DIAGNOSIS — Z7984 Long term (current) use of oral hypoglycemic drugs: Secondary | ICD-10-CM | POA: Diagnosis not present

## 2017-05-15 DIAGNOSIS — J0100 Acute maxillary sinusitis, unspecified: Secondary | ICD-10-CM | POA: Diagnosis not present

## 2017-05-15 DIAGNOSIS — R05 Cough: Secondary | ICD-10-CM | POA: Diagnosis not present

## 2017-05-15 DIAGNOSIS — E119 Type 2 diabetes mellitus without complications: Secondary | ICD-10-CM | POA: Diagnosis not present

## 2017-05-15 MED FILL — AMOXICILLIN 875 MG TABLET: 875 | 10 days supply | Qty: 20 | Fill #0

## 2017-05-15 MED FILL — BENZONATATE 200 MG CAP: 200 | 10 days supply | Qty: 30 | Fill #0

## 2017-05-18 MED FILL — metFORMIN HCL 500 MG TABS: 500 | 90 days supply | Qty: 270 | Fill #0

## 2017-05-26 MED FILL — LISINOPRIL 5 MG TAB: 5 | 90 days supply | Qty: 90 | Fill #2

## 2017-05-29 ENCOUNTER — Ambulatory Visit: Payer: Self-pay

## 2017-05-31 MED FILL — ATORVASTATIN 40 MG TABLET: 40 | 90 days supply | Qty: 90 | Fill #1

## 2017-06-14 MED FILL — SERTRALINE HCL 100 MG TAB: 100 | 90 days supply | Qty: 180 | Fill #2

## 2017-06-16 ENCOUNTER — Ambulatory Visit
Admission: RE | Admit: 2017-06-16 | Discharge: 2017-06-16 | Disposition: A | Discharge status: Home / Self Care | Payer: 59 | Source: Ambulatory Visit | Attending: Family Medicine | Admitting: Family Medicine

## 2017-06-16 DIAGNOSIS — Z1231 Encounter for screening mammogram for malignant neoplasm of breast: Secondary | ICD-10-CM

## 2017-06-21 DIAGNOSIS — E119 Type 2 diabetes mellitus without complications: Secondary | ICD-10-CM | POA: Diagnosis not present

## 2017-06-21 DIAGNOSIS — Z7984 Long term (current) use of oral hypoglycemic drugs: Secondary | ICD-10-CM | POA: Diagnosis not present

## 2017-08-02 ENCOUNTER — Other Ambulatory Visit: Payer: Self-pay | Admitting: Occupational Medicine

## 2017-08-02 ENCOUNTER — Ambulatory Visit: Payer: Self-pay

## 2017-08-02 DIAGNOSIS — M533 Sacrococcygeal disorders, not elsewhere classified: Secondary | ICD-10-CM

## 2017-08-10 MED FILL — metFORMIN HCL 500 MG TABS: 500 | 90 days supply | Qty: 270 | Fill #0

## 2017-08-30 DIAGNOSIS — F338 Other recurrent depressive disorders: Secondary | ICD-10-CM | POA: Diagnosis not present

## 2017-08-30 DIAGNOSIS — K760 Fatty (change of) liver, not elsewhere classified: Secondary | ICD-10-CM | POA: Diagnosis not present

## 2017-08-30 DIAGNOSIS — E782 Mixed hyperlipidemia: Secondary | ICD-10-CM | POA: Diagnosis not present

## 2017-08-30 DIAGNOSIS — Z0000 Encounter for general adult medical examination without abnormal findings: Secondary | ICD-10-CM | POA: Diagnosis not present

## 2017-08-30 DIAGNOSIS — E118 Type 2 diabetes mellitus with unspecified complications: Secondary | ICD-10-CM | POA: Diagnosis not present

## 2017-08-30 DIAGNOSIS — G4730 Sleep apnea, unspecified: Secondary | ICD-10-CM | POA: Diagnosis not present

## 2017-08-30 DIAGNOSIS — Z86010 Personal history of colonic polyps: Secondary | ICD-10-CM | POA: Diagnosis not present

## 2017-08-30 DIAGNOSIS — R809 Proteinuria, unspecified: Secondary | ICD-10-CM | POA: Diagnosis not present

## 2017-08-30 DIAGNOSIS — R8290 Unspecified abnormal findings in urine: Secondary | ICD-10-CM | POA: Diagnosis not present

## 2017-08-30 DIAGNOSIS — I10 Essential (primary) hypertension: Secondary | ICD-10-CM | POA: Diagnosis not present

## 2017-09-04 MED FILL — ATORVASTATIN 40 MG TABLET: 40 | 90 days supply | Qty: 90 | Fill #0

## 2017-09-04 MED FILL — LISINOPRIL 5 MG TABLET: 5 | 90 days supply | Qty: 90 | Fill #0

## 2017-09-18 DIAGNOSIS — E119 Type 2 diabetes mellitus without complications: Secondary | ICD-10-CM | POA: Diagnosis not present

## 2017-09-20 MED FILL — SERTRALINE HCL 100 MG TAB: 100 | 90 days supply | Qty: 180 | Fill #0

## 2017-11-15 MED FILL — metFORMIN HCL 500 MG TABS: 500 | 90 days supply | Qty: 270 | Fill #0

## 2017-11-26 MED FILL — LISINOPRIL 5 MG TABLET: 5 | 90 days supply | Qty: 90 | Fill #1

## 2017-11-27 MED FILL — ATORVASTATIN 40 MG TABLET: 40 | 90 days supply | Qty: 90 | Fill #1

## 2017-12-20 MED FILL — SERTRALINE HCL 100 MG TAB: 100 | 90 days supply | Qty: 180 | Fill #1

## 2018-02-07 MED FILL — metFORMIN HCL 500 MG TABS: 500 | 90 days supply | Qty: 270 | Fill #1

## 2018-02-20 MED FILL — LISINOPRIL 5 MG TABLET: 5 | 90 days supply | Qty: 90 | Fill #2

## 2018-02-20 MED FILL — ATORVASTATIN 40 MG TABLET: 40 | 90 days supply | Qty: 90 | Fill #2

## 2018-03-14 MED FILL — SERTRALINE HCL 100 MG TAB: 100 | 90 days supply | Qty: 180 | Fill #2

## 2018-04-24 DIAGNOSIS — K6289 Other specified diseases of anus and rectum: Secondary | ICD-10-CM | POA: Diagnosis not present

## 2018-05-03 MED FILL — metFORMIN HCL 500 MG TABS: 500 | 90 days supply | Qty: 270 | Fill #2

## 2018-05-21 MED FILL — LISINOPRIL 5 MG TABLET: 5 | 90 days supply | Qty: 90 | Fill #3

## 2018-05-21 MED FILL — ATORVASTATIN 40 MG TABLET: 40 | 90 days supply | Qty: 90 | Fill #3

## 2018-06-12 MED FILL — SERTRALINE HCL 100 MG TAB: 100 | 90 days supply | Qty: 180 | Fill #3

## 2018-07-30 MED FILL — metFORMIN HCL 500 MG TABS: 500 | 90 days supply | Qty: 270 | Fill #3

## 2018-08-03 MED FILL — OSELTAMIVIR PHOSPHATE 75 MG: 75 | 10 days supply | Qty: 10 | Fill #0

## 2018-08-20 ENCOUNTER — Other Ambulatory Visit: Payer: Self-pay | Admitting: Family Medicine

## 2018-08-20 DIAGNOSIS — Z1231 Encounter for screening mammogram for malignant neoplasm of breast: Secondary | ICD-10-CM

## 2018-08-22 ENCOUNTER — Ambulatory Visit
Admission: RE | Admit: 2018-08-22 | Discharge: 2018-08-22 | Disposition: A | Discharge status: Home / Self Care | Payer: 59 | Source: Ambulatory Visit

## 2018-08-22 DIAGNOSIS — Z1231 Encounter for screening mammogram for malignant neoplasm of breast: Secondary | ICD-10-CM | POA: Diagnosis not present

## 2018-08-27 MED FILL — ATORVASTATIN 40 MG TABLET: 40 | 90 days supply | Qty: 90 | Fill #0

## 2018-08-27 MED FILL — LISINOPRIL 5 MG TABLET: 5 | 90 days supply | Qty: 90 | Fill #0

## 2018-09-11 MED FILL — SERTRALINE HCL 100 MG TAB: 100 | 90 days supply | Qty: 180 | Fill #0

## 2018-10-12 DIAGNOSIS — R809 Proteinuria, unspecified: Secondary | ICD-10-CM | POA: Diagnosis not present

## 2018-10-12 DIAGNOSIS — F338 Other recurrent depressive disorders: Secondary | ICD-10-CM | POA: Diagnosis not present

## 2018-10-12 DIAGNOSIS — K760 Fatty (change of) liver, not elsewhere classified: Secondary | ICD-10-CM | POA: Diagnosis not present

## 2018-10-12 DIAGNOSIS — G4730 Sleep apnea, unspecified: Secondary | ICD-10-CM | POA: Diagnosis not present

## 2018-10-12 DIAGNOSIS — H6123 Impacted cerumen, bilateral: Secondary | ICD-10-CM | POA: Diagnosis not present

## 2018-10-12 DIAGNOSIS — E118 Type 2 diabetes mellitus with unspecified complications: Secondary | ICD-10-CM | POA: Diagnosis not present

## 2018-10-12 DIAGNOSIS — I10 Essential (primary) hypertension: Secondary | ICD-10-CM | POA: Diagnosis not present

## 2018-10-12 DIAGNOSIS — Z0000 Encounter for general adult medical examination without abnormal findings: Secondary | ICD-10-CM | POA: Diagnosis not present

## 2018-10-12 DIAGNOSIS — K529 Noninfective gastroenteritis and colitis, unspecified: Secondary | ICD-10-CM | POA: Diagnosis not present

## 2018-10-12 DIAGNOSIS — E782 Mixed hyperlipidemia: Secondary | ICD-10-CM | POA: Diagnosis not present

## 2018-10-12 DIAGNOSIS — Z86010 Personal history of colonic polyps: Secondary | ICD-10-CM | POA: Diagnosis not present

## 2018-11-06 ENCOUNTER — Other Ambulatory Visit: Payer: Self-pay | Admitting: Pharmacist

## 2018-11-06 DIAGNOSIS — K591 Functional diarrhea: Secondary | ICD-10-CM | POA: Diagnosis not present

## 2018-11-06 DIAGNOSIS — R159 Full incontinence of feces: Secondary | ICD-10-CM | POA: Diagnosis not present

## 2018-11-06 DIAGNOSIS — Z800 Family history of malignant neoplasm of digestive organs: Secondary | ICD-10-CM | POA: Diagnosis not present

## 2018-11-06 MED FILL — CHOLESTYRAMINE PACKET: 4 | 7 days supply | Qty: 14 | Fill #0

## 2018-11-07 DIAGNOSIS — H524 Presbyopia: Secondary | ICD-10-CM | POA: Diagnosis not present

## 2018-11-07 DIAGNOSIS — H5203 Hypermetropia, bilateral: Secondary | ICD-10-CM | POA: Diagnosis not present

## 2018-11-17 MED FILL — CHOLESTYRAMINE PACKET: 4 | 7 days supply | Qty: 14 | Fill #1

## 2018-11-26 MED FILL — ATORVASTATIN 40 MG TABLET: 40 | 90 days supply | Qty: 90 | Fill #0

## 2018-11-26 MED FILL — metFORMIN HCL 500 MG TABS: 500 | 90 days supply | Qty: 270 | Fill #0

## 2018-11-26 MED FILL — LISINOPRIL 5 MG TABLET: 5 | 90 days supply | Qty: 90 | Fill #0

## 2018-12-05 DIAGNOSIS — Z86010 Personal history of colonic polyps: Secondary | ICD-10-CM | POA: Diagnosis not present

## 2018-12-05 DIAGNOSIS — Z800 Family history of malignant neoplasm of digestive organs: Secondary | ICD-10-CM | POA: Diagnosis not present

## 2018-12-05 DIAGNOSIS — K591 Functional diarrhea: Secondary | ICD-10-CM | POA: Diagnosis not present

## 2018-12-05 DIAGNOSIS — R143 Flatulence: Secondary | ICD-10-CM | POA: Diagnosis not present

## 2018-12-05 DIAGNOSIS — R159 Full incontinence of feces: Secondary | ICD-10-CM | POA: Diagnosis not present

## 2018-12-05 MED FILL — COLESEVELAM HCL 625 MG TABS: 625 | 10 days supply | Qty: 20 | Fill #0

## 2018-12-13 MED FILL — COLESEVELAM HCL 625 MG TABS: 625 | 10 days supply | Qty: 20 | Fill #1

## 2018-12-14 MED FILL — SERTRALINE HCL 100 MG TAB: 100 | 90 days supply | Qty: 180 | Fill #0

## 2018-12-27 MED FILL — COLESEVELAM HCL 625 MG TABS: 625 | 30 days supply | Qty: 120 | Fill #0

## 2019-01-14 MED FILL — PEG-3350 SOLUTION: 420 | 1 days supply | Qty: 4000 | Fill #0

## 2019-01-18 DIAGNOSIS — Z1211 Encounter for screening for malignant neoplasm of colon: Secondary | ICD-10-CM | POA: Diagnosis not present

## 2019-01-18 DIAGNOSIS — Z800 Family history of malignant neoplasm of digestive organs: Secondary | ICD-10-CM | POA: Diagnosis not present

## 2019-01-21 MED FILL — COLESEVELAM HCL 625 MG TABS: 625 | 30 days supply | Qty: 120 | Fill #0

## 2019-01-22 DIAGNOSIS — K591 Functional diarrhea: Secondary | ICD-10-CM | POA: Diagnosis not present

## 2019-01-22 DIAGNOSIS — Z800 Family history of malignant neoplasm of digestive organs: Secondary | ICD-10-CM | POA: Diagnosis not present

## 2019-02-20 MED FILL — metFORMIN HCL 500 MG TABS: 500 | 90 days supply | Qty: 270 | Fill #0

## 2019-02-20 MED FILL — LISINOPRIL 5 MG TABLET: 5 | 90 days supply | Qty: 90 | Fill #0

## 2019-02-20 MED FILL — COLESEVELAM HCL 625 MG TABS: 625 | 30 days supply | Qty: 120 | Fill #1

## 2019-02-20 MED FILL — ATORVASTATIN 40 MG TABLET: 40 | 90 days supply | Qty: 90 | Fill #0

## 2019-03-27 MED FILL — SERTRALINE HCL 100 MG TAB: 100 | 90 days supply | Qty: 180 | Fill #0

## 2019-05-20 MED FILL — LISINOPRIL 5 MG TABS: 5 | 90 days supply | Qty: 90 | Fill #1

## 2019-05-20 MED FILL — metFORMIN HCL 500 MG TABS: 500 | 90 days supply | Qty: 270 | Fill #1

## 2019-05-20 MED FILL — COLESEVELAM HCL 625 MG TABS: 625 | 30 days supply | Qty: 120 | Fill #2

## 2019-05-22 MED FILL — ATORVASTATIN 40 MG TABLET: 40 | 90 days supply | Qty: 90 | Fill #0

## 2019-06-18 MED FILL — COLESEVELAM HCL 625 MG TABS: 625 | 30 days supply | Qty: 120 | Fill #3

## 2019-06-18 MED FILL — SERTRALINE HCL 100 MG TAB: 100 | 90 days supply | Qty: 180 | Fill #1

## 2019-07-30 DIAGNOSIS — J40 Bronchitis, not specified as acute or chronic: Secondary | ICD-10-CM | POA: Diagnosis not present

## 2019-07-30 MED FILL — BENZONATATE 100 MG CAPS: 100 | 10 days supply | Qty: 30 | Fill #0

## 2019-07-30 MED FILL — AZITHROMYCIN 250 MG TABLET: 250 | 5 days supply | Qty: 6 | Fill #0

## 2019-08-19 MED FILL — metFORMIN HCL 500 MG TABS: 500 | 90 days supply | Qty: 270 | Fill #2

## 2019-08-19 MED FILL — ATORVASTATIN 40 MG TABLET: 40 | 90 days supply | Qty: 90 | Fill #1

## 2019-08-19 MED FILL — LISINOPRIL 5 MG TABS: 5 | 90 days supply | Qty: 90 | Fill #2

## 2019-08-22 ENCOUNTER — Other Ambulatory Visit: Payer: Self-pay | Admitting: Family Medicine

## 2019-08-22 DIAGNOSIS — Z1231 Encounter for screening mammogram for malignant neoplasm of breast: Secondary | ICD-10-CM

## 2019-08-23 ENCOUNTER — Other Ambulatory Visit: Payer: Self-pay

## 2019-08-23 ENCOUNTER — Ambulatory Visit
Admission: RE | Admit: 2019-08-23 | Discharge: 2019-08-23 | Disposition: A | Discharge status: Home / Self Care | Payer: 59 | Source: Ambulatory Visit

## 2019-08-23 DIAGNOSIS — Z1231 Encounter for screening mammogram for malignant neoplasm of breast: Secondary | ICD-10-CM

## 2019-09-27 MED FILL — SERTRALINE HCL 100 MG TAB: 100 | 90 days supply | Qty: 180 | Fill #0

## 2019-09-30 MED FILL — COLESEVELAM HCL 625 MG TABS: 625 | 30 days supply | Qty: 120 | Fill #4

## 2019-11-01 DIAGNOSIS — Z23 Encounter for immunization: Secondary | ICD-10-CM | POA: Diagnosis not present

## 2019-11-01 DIAGNOSIS — Z800 Family history of malignant neoplasm of digestive organs: Secondary | ICD-10-CM | POA: Diagnosis not present

## 2019-11-01 DIAGNOSIS — E118 Type 2 diabetes mellitus with unspecified complications: Secondary | ICD-10-CM | POA: Diagnosis not present

## 2019-11-01 DIAGNOSIS — G4730 Sleep apnea, unspecified: Secondary | ICD-10-CM | POA: Diagnosis not present

## 2019-11-01 DIAGNOSIS — I10 Essential (primary) hypertension: Secondary | ICD-10-CM | POA: Diagnosis not present

## 2019-11-01 DIAGNOSIS — Z0000 Encounter for general adult medical examination without abnormal findings: Secondary | ICD-10-CM | POA: Diagnosis not present

## 2019-11-01 DIAGNOSIS — K591 Functional diarrhea: Secondary | ICD-10-CM | POA: Diagnosis not present

## 2019-11-01 DIAGNOSIS — E782 Mixed hyperlipidemia: Secondary | ICD-10-CM | POA: Diagnosis not present

## 2019-11-01 DIAGNOSIS — R809 Proteinuria, unspecified: Secondary | ICD-10-CM | POA: Diagnosis not present

## 2019-11-01 DIAGNOSIS — F338 Other recurrent depressive disorders: Secondary | ICD-10-CM | POA: Diagnosis not present

## 2019-11-11 MED FILL — ATORVASTATIN CALCIUM 40 MG: 40 | 90 days supply | Qty: 90 | Fill #2

## 2019-11-11 MED FILL — METFORMIN HCL 500 MG TABS: 500 | 90 days supply | Qty: 270 | Fill #3

## 2019-11-11 MED FILL — LISINOPRIL 5 MG TABLET: 5 | 90 days supply | Qty: 90 | Fill #3

## 2019-11-12 DIAGNOSIS — H524 Presbyopia: Secondary | ICD-10-CM | POA: Diagnosis not present

## 2019-11-12 DIAGNOSIS — H5203 Hypermetropia, bilateral: Secondary | ICD-10-CM | POA: Diagnosis not present

## 2019-11-19 DIAGNOSIS — R6883 Chills (without fever): Secondary | ICD-10-CM | POA: Diagnosis not present

## 2019-11-19 DIAGNOSIS — J3489 Other specified disorders of nose and nasal sinuses: Secondary | ICD-10-CM | POA: Diagnosis not present

## 2019-11-19 DIAGNOSIS — R05 Cough: Secondary | ICD-10-CM | POA: Diagnosis not present

## 2019-11-19 DIAGNOSIS — G44209 Tension-type headache, unspecified, not intractable: Secondary | ICD-10-CM | POA: Diagnosis not present

## 2019-11-19 MED FILL — BENZONATATE 100 MG CAPS: 100 | 7 days supply | Qty: 21 | Fill #0

## 2019-11-19 MED FILL — FLUTICASONE PROP 50 MCG SPR: 50 | 30 days supply | Qty: 16 | Fill #0

## 2019-12-16 MED FILL — COLESEVELAM HCL 625 MG TABS: 625 | 30 days supply | Qty: 120 | Fill #5

## 2019-12-30 MED FILL — SERTRALINE HCL 100 MG TABS: 100 | 90 days supply | Qty: 180 | Fill #0

## 2020-02-05 MED FILL — ATORVASTATIN CALCIUM 40 MG: 40 | 90 days supply | Qty: 90 | Fill #3

## 2020-02-06 ENCOUNTER — Other Ambulatory Visit (HOSPITAL_COMMUNITY): Payer: Self-pay | Admitting: Gastroenterology

## 2020-02-06 MED FILL — METFORMIN HCL 500 MG TABS: 500 | 90 days supply | Qty: 270 | Fill #0

## 2020-02-06 MED FILL — COLESEVELAM HCL 625 MG TABS: 625 | 30 days supply | Qty: 120 | Fill #0

## 2020-02-06 MED FILL — LISINOPRIL 5 MG TABLET: 5 | 90 days supply | Qty: 90 | Fill #0

## 2020-03-30 MED FILL — SERTRALINE HCL 100 MG TABS: 100 | 90 days supply | Qty: 180 | Fill #0

## 2020-03-30 MED FILL — COLESEVELAM HCL 625 MG TABS: 625 | 30 days supply | Qty: 120 | Fill #1

## 2020-05-07 MED FILL — LISINOPRIL 5 MG TABLET: 5 | 90 days supply | Qty: 90 | Fill #1

## 2020-05-07 MED FILL — METFORMIN HCL 500 MG TABS: 500 | 90 days supply | Qty: 270 | Fill #1

## 2020-05-19 ENCOUNTER — Other Ambulatory Visit (HOSPITAL_COMMUNITY): Payer: Self-pay | Admitting: Family Medicine

## 2020-05-19 MED FILL — COLESEVELAM HCL 625 MG TABS: 625 | 30 days supply | Qty: 120 | Fill #2

## 2020-05-19 MED FILL — ATORVASTATIN 40 MG TABLET: 40 | 90 days supply | Qty: 90 | Fill #0

## 2020-06-24 MED FILL — SERTRALINE HCL 100 MG TABS: 100 | 90 days supply | Qty: 180 | Fill #1

## 2020-07-30 ENCOUNTER — Emergency Department (HOSPITAL_BASED_OUTPATIENT_CLINIC_OR_DEPARTMENT_OTHER): Payer: PRIVATE HEALTH INSURANCE

## 2020-07-30 ENCOUNTER — Encounter (HOSPITAL_BASED_OUTPATIENT_CLINIC_OR_DEPARTMENT_OTHER): Payer: Self-pay

## 2020-07-30 ENCOUNTER — Emergency Department (HOSPITAL_BASED_OUTPATIENT_CLINIC_OR_DEPARTMENT_OTHER)
Admission: EM | Admit: 2020-07-30 | Discharge: 2020-07-30 | Disposition: A | Discharge status: Home / Self Care | Payer: PRIVATE HEALTH INSURANCE | Attending: Emergency Medicine | Admitting: Emergency Medicine

## 2020-07-30 ENCOUNTER — Other Ambulatory Visit: Payer: Self-pay

## 2020-07-30 DIAGNOSIS — E119 Type 2 diabetes mellitus without complications: Secondary | ICD-10-CM | POA: Insufficient documentation

## 2020-07-30 DIAGNOSIS — H538 Other visual disturbances: Secondary | ICD-10-CM | POA: Insufficient documentation

## 2020-07-30 DIAGNOSIS — R519 Headache, unspecified: Secondary | ICD-10-CM | POA: Diagnosis not present

## 2020-07-30 DIAGNOSIS — Z87891 Personal history of nicotine dependence: Secondary | ICD-10-CM | POA: Insufficient documentation

## 2020-07-30 DIAGNOSIS — Z79899 Other long term (current) drug therapy: Secondary | ICD-10-CM | POA: Insufficient documentation

## 2020-07-30 DIAGNOSIS — M545 Low back pain, unspecified: Secondary | ICD-10-CM | POA: Insufficient documentation

## 2020-07-30 DIAGNOSIS — Z7984 Long term (current) use of oral hypoglycemic drugs: Secondary | ICD-10-CM | POA: Diagnosis not present

## 2020-07-30 MED ORDER — DIPHENHYDRAMINE HCL 50 MG/ML IJ SOLN
25.0000 mg | Freq: Once | INTRAMUSCULAR | Status: AC
  Administered 2020-07-30: 25 mg via INTRAMUSCULAR
  Filled 2020-07-30: qty 1

## 2020-07-30 MED ORDER — METOCLOPRAMIDE HCL 5 MG/ML IJ SOLN
10.0000 mg | Freq: Once | INTRAMUSCULAR | Status: AC
  Administered 2020-07-30: 10 mg via INTRAMUSCULAR
  Filled 2020-07-30: qty 2

## 2020-07-30 NOTE — Discharge Instructions (Signed)
If you develop continued, recurrent, or worsening headache, fever, neck stiffness, vomiting, blurry or double vision, weakness or numbness in your arms or legs, trouble speaking, or any other new/concerning symptoms then return to the ER for evaluation.  

## 2020-07-30 NOTE — ED Provider Notes (Signed)
Taylor Barnes   CSN: [id number] Arrival date & time: 07/30/20  1850     History Chief Complaint  Patient presents with  . Trauma    Taylor Barnes Ide is a 65 y.o. female.  HPI 65 year old female presents with chief complaint of headache.  Yesterday morning she was stocking shelves and someone had put a bag of water that was heavier than she expected on a high shelf.  When she got it down it felt like yanked her left shoulder and she also got some low back pain.  It barely grazed her head but now she is having a headache that is very severe.  She has a history of migraines but this 1 feels different because it is causing her to have blurry vision.  No double vision.  No weakness or numbness.  The shoulder and back are getting a little better.  She took ibuprofen for the headache.   Past Medical History:  Diagnosis Date  . 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 Anxiety   . Arthritis    both shoulders  . Complication of anesthesia    trouble waking up  . Depression   . Diabetes mellitus without complication ([place])    takes lisinopril to protect kidneys  . Headache(784.0)    hx of migraines  . Hyperlipidemia   . Sleep apnea    CPAP nightly  . Tendonitis, Achilles, right     Patient Active Problem List   Diagnosis Date Noted  . Diabetes ([place]) 04/02/2014    Past Surgical History:  Procedure Laterality Date  .   c sections     x 2  . ABLATION     uteres  . BILATERAL TEMPOROMANDIBULAR JOINT ARTHROPLASTY  age 64  . BREAST BIOPSY Right yrs ago  . CARPAL TUNNEL RELEASE Bilateral    multiple times both hands  . CHOLECYSTECTOMY  yrs ago  . COLONOSCOPY N/A 12/06/2013   Procedure: COLONOSCOPY;  Surgeon: Cleotis Nipper, MD;  Location: [facility];  Service: Endoscopy;  Laterality: N/A;  . cyst removed from hand  yrs ago  . DILATION AND CURETTAGE OF UTERUS  yrs ago  . EXCISION HAGLUND'S DEFORMITY WITH ACHILLES  TENDON REPAIR Right 07/09/2015   Procedure: ACHILLES DEBRIDEMENT AND RECONSTRUCTION; EXCISION OF HAGLUND DEFORMITY;  Surgeon: Wylene Simmer, MD;  Location: [place];  Service: Orthopedics;  Laterality: Right;  . GASTROCNEMIUS RECESSION Right 07/09/2015   Procedure: RIGHT GASTROC RECESSION ;  Surgeon: Wylene Simmer, MD;  Location: [place];  Service: Orthopedics;  Laterality: Right;  . LUNG BIOPSY  age 89     OB History   No obstetric history on file.     Family History  Problem Relation Age of Onset  . Breast cancer Mother     Social History   Tobacco Use  . Smoking status: Former Smoker    Quit date: 12/06/1985    Years since quitting: 34.6  . Smokeless tobacco: Never Used  Vaping Use  . Vaping Use: Never used  Substance Use Topics  . Alcohol use: No  . Drug use: No    Home Medications Prior to Admission medications   Medication Sig Start Date End Date Taking? Authorizing Provider  atorvastatin (LIPITOR) 40 MG tablet Take 40 mg by mouth every morning.     [provider]  cephALEXin (KEFLEX) 500 MG capsule Take 1 capsule (500 mg total) by mouth 2 (two)  times daily. 06/02/16   Gardiner Barefoot, DPM  ibuprofen (ADVIL,MOTRIN) 200 MG tablet Take 600 mg by mouth every 6 (six) hours as needed for moderate pain.     [provider]  lisinopril (PRINIVIL,ZESTRIL) 5 MG tablet Take 5 mg by mouth every morning.     [provider]  metFORMIN (GLUCOPHAGE) 500 MG tablet Take 500-1,000 mg by mouth 2 (two) times daily with a meal. 1 tablet in the morning and 2 in the afternoon    [provider]  sertraline (ZOLOFT) 100 MG tablet Take 200 mg by mouth daily.    [provider]    Allergies    Aspirin  Review of Systems   Review of Systems  Musculoskeletal: Positive for arthralgias and back pain.  Neurological: Positive for headaches. Negative for dizziness, weakness and numbness.  All other systems reviewed and  are negative.   Physical Exam Updated Vital Signs BP 92/61 (BP Location: Right Arm)   Pulse 80   Temp 99 F (37.2 C) (Oral)   Resp 18   Ht 5\' 4"  (1.626 m)   Wt 103.4 kg   SpO2 95%   BMI 39.14 kg/m   Physical Exam Vitals and nursing Barnes reviewed.  Constitutional:      General: She is not in acute distress.    Appearance: She is well-developed and well-nourished. She is obese. She is not ill-appearing or diaphoretic.  HENT:     Head: Normocephalic and atraumatic.     Right Ear: External ear normal.     Left Ear: External ear normal.     Nose: Nose normal.  Eyes:     General:        Right eye: No discharge.        Left eye: No discharge.     Extraocular Movements: Extraocular movements intact.     Pupils: Pupils are equal, round, and reactive to light.  Cardiovascular:     Rate and Rhythm: Normal rate and regular rhythm.     Heart sounds: Normal heart sounds.  Pulmonary:     Effort: Pulmonary effort is normal.     Breath sounds: Normal breath sounds.  Abdominal:     General: There is no distension.     Palpations: Abdomen is soft.     Tenderness: There is no abdominal tenderness.  Musculoskeletal:     Left shoulder: No tenderness. Normal range of motion.     Lumbar back: No tenderness.  Skin:    General: Skin is warm and dry.  Neurological:     Mental Status: She is alert.     Comments: CN 3-12 grossly intact. 5/5 strength in all 4 extremities. Grossly normal sensation. Normal finger to nose.   Psychiatric:        Mood and Affect: Mood is not anxious.     ED Results / Procedures / Treatments   Labs (all labs ordered are listed, but only abnormal results are displayed) Labs Reviewed - No data to display  EKG None  Radiology CT Head Wo Contrast  Result Date: 07/30/2020 CLINICAL DATA:  Fall at work, headache EXAM: CT HEAD WITHOUT CONTRAST TECHNIQUE: Contiguous axial images were obtained from the base of the skull through the vertex without intravenous  contrast. COMPARISON:  None. FINDINGS: Brain: No evidence of acute infarction, hemorrhage, hydrocephalus, extra-axial collection, visible mass lesion or mass effect. Vascular: No hyperdense vessel or unexpected calcification. Skull: No calvarial fracture or suspicious osseous lesion. No scalp swelling or hematoma. Sinuses/Orbits:  Subtotal opacification of the right frontal sinus with additional mild mural thickening in the ethmoids and maxillary sinuses. Mastoid air cells are predominantly clear with some asymmetric hypo pneumatization on the right. Middle ear cavities are clear. Small amount of debris in the bilateral external auditory canals. Included orbital structures are unremarkable. Other: None IMPRESSION: 1. No acute intracranial findings. No scalp swelling or calvarial fracture. 2. Subtotal opacification of the right frontal sinus with additional mild mural thickening in the ethmoids and maxillary sinuses. Correlate for features of acute sinusitis. 3. Small amount of debris in the bilateral external auditory canals, correlate for cerumen impaction. Electronically Signed   By: Lovena Le M.D.   On: 07/30/2020 20:19    Procedures Procedures   Medications Ordered in ED Medications  metoCLOPramide (REGLAN) injection 10 mg (10 mg Intramuscular Given 07/30/20 2018)  diphenhydrAMINE (BENADRYL) injection 25 mg (25 mg Intramuscular Given 07/30/20 2016)    ED Course  I have reviewed the triage vital signs and the nursing notes.  Pertinent labs & imaging results that were available during my care of the patient were reviewed by me and considered in my medical decision making (see chart for details).    MDM Rules/Calculators/A&P                          Given patient's severe headache and blurry vision after this episode, CT head was obtained.  Her neuro exam is unremarkable.  There is no double vision.  My suspicion for subarachnoid hemorrhage, CNS infection, or missed head bleed on this CT is  pretty low.  CT images have been reviewed by myself.  She is feeling somewhat better with Reglan/Benadryl and would like to go home and sleep.  Likely has a headache/migraine that is exacerbated by the episode at work rather than significant head injury.  Will discharge home with return precautions. Final Clinical Impression(s) / ED Diagnoses Final diagnoses:  Bad headache    Rx / DC Orders ED Discharge Orders    None       Sherwood Gambler, MD 07/30/20 2121

## 2020-07-30 NOTE — ED Triage Notes (Signed)
Pt states supplies at work fell on her ~630am yesterday-pain to left shoulder, lower back and HA-NAD-steady gait

## 2020-08-03 ENCOUNTER — Other Ambulatory Visit: Payer: Self-pay | Admitting: Family Medicine

## 2020-08-03 DIAGNOSIS — Z1231 Encounter for screening mammogram for malignant neoplasm of breast: Secondary | ICD-10-CM

## 2020-08-06 MED FILL — METFORMIN HCL 500 MG TABS: 500 | 90 days supply | Qty: 270 | Fill #2

## 2020-08-06 MED FILL — LISINOPRIL 5 MG TABLET: 5 | 90 days supply | Qty: 90 | Fill #2

## 2020-08-06 MED FILL — COLESEVELAM HCL 625 MG TABS: 625 | 30 days supply | Qty: 120 | Fill #3

## 2020-08-13 MED FILL — ATORVASTATIN 40 MG TABLET: 40 | 90 days supply | Qty: 90 | Fill #1

## 2020-09-04 ENCOUNTER — Ambulatory Visit
Admission: RE | Admit: 2020-09-04 | Discharge: 2020-09-04 | Disposition: A | Discharge status: Home / Self Care | Payer: 59 | Source: Ambulatory Visit

## 2020-09-04 ENCOUNTER — Other Ambulatory Visit: Payer: Self-pay

## 2020-09-04 DIAGNOSIS — Z1231 Encounter for screening mammogram for malignant neoplasm of breast: Secondary | ICD-10-CM | POA: Diagnosis not present

## 2020-09-09 ENCOUNTER — Other Ambulatory Visit (HOSPITAL_BASED_OUTPATIENT_CLINIC_OR_DEPARTMENT_OTHER): Payer: Self-pay

## 2020-09-21 ENCOUNTER — Other Ambulatory Visit (HOSPITAL_COMMUNITY): Payer: Self-pay

## 2020-09-21 MED FILL — Sertraline HCl Tab 100 MG: ORAL | 90 days supply | Qty: 180 | Fill #0 | Status: AC

## 2020-09-24 ENCOUNTER — Other Ambulatory Visit (HOSPITAL_COMMUNITY): Payer: Self-pay

## 2020-10-21 ENCOUNTER — Other Ambulatory Visit (HOSPITAL_COMMUNITY): Payer: Self-pay

## 2020-10-21 MED ORDER — LISINOPRIL 5 MG PO TABS
5.0000 mg | ORAL_TABLET | Freq: Every day | ORAL | 0 refills | Status: DC
  Filled 2020-10-21: qty 90, 90d supply, fill #0

## 2020-10-22 ENCOUNTER — Other Ambulatory Visit (HOSPITAL_COMMUNITY): Payer: Self-pay

## 2020-10-26 ENCOUNTER — Other Ambulatory Visit (HOSPITAL_COMMUNITY): Payer: Self-pay

## 2020-10-26 MED ORDER — COLESEVELAM HCL 625 MG PO TABS
ORAL_TABLET | ORAL | 1 refills | Status: DC
  Filled 2020-10-26: qty 120, 30d supply, fill #0
  Filled 2020-12-09: qty 120, 30d supply, fill #1

## 2020-11-02 ENCOUNTER — Other Ambulatory Visit (HOSPITAL_COMMUNITY): Payer: Self-pay

## 2020-11-13 ENCOUNTER — Other Ambulatory Visit (HOSPITAL_COMMUNITY): Payer: Self-pay

## 2020-11-13 DIAGNOSIS — G4700 Insomnia, unspecified: Secondary | ICD-10-CM | POA: Diagnosis not present

## 2020-11-13 DIAGNOSIS — E119 Type 2 diabetes mellitus without complications: Secondary | ICD-10-CM | POA: Diagnosis not present

## 2020-11-13 DIAGNOSIS — H5203 Hypermetropia, bilateral: Secondary | ICD-10-CM | POA: Diagnosis not present

## 2020-11-13 DIAGNOSIS — Z13220 Encounter for screening for lipoid disorders: Secondary | ICD-10-CM | POA: Diagnosis not present

## 2020-11-13 DIAGNOSIS — Z131 Encounter for screening for diabetes mellitus: Secondary | ICD-10-CM | POA: Diagnosis not present

## 2020-11-13 DIAGNOSIS — G4730 Sleep apnea, unspecified: Secondary | ICD-10-CM | POA: Diagnosis not present

## 2020-11-13 DIAGNOSIS — E782 Mixed hyperlipidemia: Secondary | ICD-10-CM | POA: Diagnosis not present

## 2020-11-13 DIAGNOSIS — F329 Major depressive disorder, single episode, unspecified: Secondary | ICD-10-CM | POA: Diagnosis not present

## 2020-11-13 DIAGNOSIS — Z0000 Encounter for general adult medical examination without abnormal findings: Secondary | ICD-10-CM | POA: Diagnosis not present

## 2020-11-13 DIAGNOSIS — I10 Essential (primary) hypertension: Secondary | ICD-10-CM | POA: Diagnosis not present

## 2020-11-13 MED ORDER — METFORMIN HCL 500 MG PO TABS
ORAL_TABLET | ORAL | 3 refills | Status: DC
  Filled 2020-11-13: qty 270, 90d supply, fill #0
  Filled 2021-02-01: qty 270, 90d supply, fill #1
  Filled 2021-05-23: qty 270, 90d supply, fill #2

## 2020-11-13 MED ORDER — LISINOPRIL 5 MG PO TABS
5.0000 mg | ORAL_TABLET | Freq: Every day | ORAL | 0 refills | Status: DC
  Filled 2020-11-13 – 2021-02-01 (×2): qty 90, 90d supply, fill #0

## 2020-11-13 MED ORDER — ATORVASTATIN CALCIUM 40 MG PO TABS
ORAL_TABLET | ORAL | 2 refills | Status: DC
  Filled 2020-11-13: qty 90, 90d supply, fill #0
  Filled 2021-02-01: qty 90, 90d supply, fill #1
  Filled 2021-05-23: qty 90, 90d supply, fill #2

## 2020-11-13 MED ORDER — COLESEVELAM HCL 625 MG PO TABS
ORAL_TABLET | ORAL | 2 refills | Status: DC
  Filled 2020-11-13 – 2021-02-01 (×2): qty 120, 30d supply, fill #0
  Filled 2021-04-22: qty 120, 30d supply, fill #1
  Filled 2021-06-09: qty 120, 30d supply, fill #2

## 2020-11-13 MED ORDER — SERTRALINE HCL 100 MG PO TABS
ORAL_TABLET | ORAL | 3 refills | Status: DC
  Filled 2020-11-13 – 2020-12-24 (×2): qty 180, 90d supply, fill #0
  Filled 2021-03-27: qty 180, 90d supply, fill #1
  Filled 2021-06-09: qty 180, 90d supply, fill #2
  Filled 2021-09-20: qty 180, 90d supply, fill #3

## 2020-12-09 ENCOUNTER — Other Ambulatory Visit (HOSPITAL_COMMUNITY): Payer: Self-pay

## 2020-12-24 ENCOUNTER — Other Ambulatory Visit (HOSPITAL_COMMUNITY): Payer: Self-pay

## 2020-12-28 ENCOUNTER — Other Ambulatory Visit (HOSPITAL_COMMUNITY): Payer: Self-pay

## 2021-02-01 ENCOUNTER — Other Ambulatory Visit (HOSPITAL_COMMUNITY): Payer: Self-pay

## 2021-03-27 ENCOUNTER — Other Ambulatory Visit (HOSPITAL_COMMUNITY): Payer: Self-pay

## 2021-04-22 ENCOUNTER — Other Ambulatory Visit (HOSPITAL_COMMUNITY): Payer: Self-pay

## 2021-05-24 ENCOUNTER — Other Ambulatory Visit (HOSPITAL_COMMUNITY): Payer: Self-pay

## 2021-05-27 ENCOUNTER — Other Ambulatory Visit (HOSPITAL_COMMUNITY): Payer: Self-pay

## 2021-05-27 MED ORDER — LISINOPRIL 5 MG PO TABS
5.0000 mg | ORAL_TABLET | Freq: Every day | ORAL | 3 refills | Status: DC
  Filled 2021-05-27: qty 90, 90d supply, fill #0
  Filled 2021-07-27 – 2021-09-06 (×2): qty 90, 90d supply, fill #1
  Filled 2021-11-28: qty 90, 90d supply, fill #2
  Filled 2022-02-28: qty 90, 90d supply, fill #3

## 2021-06-09 ENCOUNTER — Other Ambulatory Visit (HOSPITAL_COMMUNITY): Payer: Self-pay

## 2021-06-09 MED ORDER — ATORVASTATIN CALCIUM 40 MG PO TABS
ORAL_TABLET | ORAL | 3 refills | Status: DC
  Filled 2021-06-09 – 2021-07-27 (×2): qty 90, 90d supply, fill #0

## 2021-07-19 ENCOUNTER — Other Ambulatory Visit (HOSPITAL_COMMUNITY): Payer: Self-pay

## 2021-07-19 MED ORDER — METFORMIN HCL 500 MG PO TABS
ORAL_TABLET | ORAL | 2 refills | Status: AC
  Filled 2021-07-19 – 2021-08-24 (×3): qty 270, 90d supply, fill #0
  Filled 2021-11-22: qty 270, 90d supply, fill #1
  Filled 2022-02-15: qty 270, 90d supply, fill #2

## 2021-07-26 ENCOUNTER — Other Ambulatory Visit (HOSPITAL_COMMUNITY): Payer: Self-pay

## 2021-07-26 DIAGNOSIS — L539 Erythematous condition, unspecified: Secondary | ICD-10-CM | POA: Diagnosis not present

## 2021-07-26 DIAGNOSIS — M79606 Pain in leg, unspecified: Secondary | ICD-10-CM | POA: Diagnosis not present

## 2021-07-26 DIAGNOSIS — R600 Localized edema: Secondary | ICD-10-CM | POA: Diagnosis not present

## 2021-07-26 DIAGNOSIS — M25552 Pain in left hip: Secondary | ICD-10-CM | POA: Diagnosis not present

## 2021-07-26 DIAGNOSIS — M2550 Pain in unspecified joint: Secondary | ICD-10-CM | POA: Diagnosis not present

## 2021-07-26 MED ORDER — DOXYCYCLINE HYCLATE 100 MG PO CAPS
ORAL_CAPSULE | ORAL | 0 refills | Status: DC
  Filled 2021-07-26: qty 14, 7d supply, fill #0

## 2021-07-27 ENCOUNTER — Other Ambulatory Visit (HOSPITAL_COMMUNITY): Payer: Self-pay

## 2021-07-27 MED ORDER — ATORVASTATIN CALCIUM 40 MG PO TABS
ORAL_TABLET | ORAL | 3 refills | Status: AC
  Filled 2021-07-27 – 2021-08-24 (×2): qty 90, 90d supply, fill #0
  Filled 2021-08-27 – 2021-11-22 (×2): qty 90, 90d supply, fill #1
  Filled 2021-11-28 – 2022-02-15 (×2): qty 90, 90d supply, fill #2
  Filled 2022-05-16: qty 90, 90d supply, fill #3

## 2021-07-27 MED ORDER — COLESEVELAM HCL 625 MG PO TABS
ORAL_TABLET | ORAL | 3 refills | Status: DC
  Filled 2021-07-27: qty 120, 30d supply, fill #0
  Filled 2021-08-24: qty 120, 30d supply, fill #1
  Filled 2022-02-15: qty 120, 30d supply, fill #2
  Filled 2022-04-04 – 2022-04-27 (×2): qty 120, 30d supply, fill #3

## 2021-07-28 DIAGNOSIS — H02831 Dermatochalasis of right upper eyelid: Secondary | ICD-10-CM | POA: Diagnosis not present

## 2021-07-28 DIAGNOSIS — H25813 Combined forms of age-related cataract, bilateral: Secondary | ICD-10-CM | POA: Diagnosis not present

## 2021-07-28 DIAGNOSIS — H04123 Dry eye syndrome of bilateral lacrimal glands: Secondary | ICD-10-CM | POA: Diagnosis not present

## 2021-07-28 DIAGNOSIS — H02401 Unspecified ptosis of right eyelid: Secondary | ICD-10-CM | POA: Diagnosis not present

## 2021-07-29 ENCOUNTER — Other Ambulatory Visit (HOSPITAL_COMMUNITY): Payer: Self-pay

## 2021-08-06 DIAGNOSIS — H02831 Dermatochalasis of right upper eyelid: Secondary | ICD-10-CM | POA: Diagnosis not present

## 2021-08-06 DIAGNOSIS — H57813 Brow ptosis, bilateral: Secondary | ICD-10-CM | POA: Diagnosis not present

## 2021-08-06 DIAGNOSIS — H02413 Mechanical ptosis of bilateral eyelids: Secondary | ICD-10-CM | POA: Diagnosis not present

## 2021-08-06 DIAGNOSIS — H0279 Other degenerative disorders of eyelid and periocular area: Secondary | ICD-10-CM | POA: Diagnosis not present

## 2021-08-06 DIAGNOSIS — D485 Neoplasm of uncertain behavior of skin: Secondary | ICD-10-CM | POA: Diagnosis not present

## 2021-08-06 DIAGNOSIS — H53483 Generalized contraction of visual field, bilateral: Secondary | ICD-10-CM | POA: Diagnosis not present

## 2021-08-06 DIAGNOSIS — H02834 Dermatochalasis of left upper eyelid: Secondary | ICD-10-CM | POA: Diagnosis not present

## 2021-08-06 DIAGNOSIS — H04562 Stenosis of left lacrimal punctum: Secondary | ICD-10-CM | POA: Diagnosis not present

## 2021-08-06 DIAGNOSIS — H04222 Epiphora due to insufficient drainage, left lacrimal gland: Secondary | ICD-10-CM | POA: Diagnosis not present

## 2021-08-10 ENCOUNTER — Other Ambulatory Visit: Payer: Self-pay | Admitting: Family Medicine

## 2021-08-10 DIAGNOSIS — Z1231 Encounter for screening mammogram for malignant neoplasm of breast: Secondary | ICD-10-CM

## 2021-08-24 ENCOUNTER — Encounter: Payer: Self-pay | Admitting: *Deleted

## 2021-08-24 ENCOUNTER — Other Ambulatory Visit (HOSPITAL_COMMUNITY): Payer: Self-pay

## 2021-08-24 DIAGNOSIS — Z1231 Encounter for screening mammogram for malignant neoplasm of breast: Secondary | ICD-10-CM

## 2021-08-25 ENCOUNTER — Encounter: Payer: Self-pay | Admitting: Diagnostic Neuroimaging

## 2021-08-25 ENCOUNTER — Other Ambulatory Visit: Payer: Self-pay

## 2021-08-25 ENCOUNTER — Ambulatory Visit: Payer: 59 | Admitting: Diagnostic Neuroimaging

## 2021-08-25 VITALS — BP 124/70 | HR 64 | Ht 64.0 in | Wt 225.6 lb

## 2021-08-25 DIAGNOSIS — H02403 Unspecified ptosis of bilateral eyelids: Secondary | ICD-10-CM | POA: Diagnosis not present

## 2021-08-25 DIAGNOSIS — H53483 Generalized contraction of visual field, bilateral: Secondary | ICD-10-CM | POA: Diagnosis not present

## 2021-08-25 NOTE — Patient Instructions (Signed)
Check labs 

## 2021-08-25 NOTE — Progress Notes (Signed)
[facility]  PATIENT: Taylor Barnes DOB: 1955/08/28  REFERRING CLINICIAN: Melissa Noon, OD HISTORY FROM: patient  REASON FOR VISIT: new consult   HISTORICAL  CHIEF COMPLAINT:  Chief Complaint  Patient presents with   Dermatochalasis with Ptosis    Rm 7 New Pt     HISTORY OF PRESENT ILLNESS:   66 year old female here for evaluation of bilateral ptosis.  Patient has had 10-year gradual onset progressive bilateral ptosis.  No fluctuation symptoms.  No double vision.  No slurred speech, trouble swallowing.  No generalized muscle weakness.  Patient went to eye doctor for evaluation and was recommended to have neurology and neuromuscular evaluation to rule out other causes of ptosis.    REVIEW OF SYSTEMS: Full 14 system review of systems performed and negative with exception of: as per HPI.  ALLERGIES: Allergies  Allergen Reactions   Aspirin Nausea And Vomiting   Bupropion Other (See Comments)    Other reaction(s): GI upset    HOME MEDICATIONS: Outpatient Medications Prior to Visit  Medication Sig Dispense Refill   atorvastatin (LIPITOR) 40 MG tablet Take 1 tablet by mouth once daily 90 tablet 3   colesevelam (WELCHOL) 625 MG tablet TAKE 2 TABLETS BY MOUTH TWO TIMES DAILY * PATIENT NEEDS APPOINTMENT 120 tablet 3   lisinopril (ZESTRIL) 5 MG tablet Take 1 tablet (5 mg total) by mouth daily. 90 tablet 3   metFORMIN (GLUCOPHAGE) 500 MG tablet TAKE 1 TABLET BY MOUTH IN THE MORNING AND 2 TABLETS IN THE EVENING 270 tablet 2   sertraline (ZOLOFT) 100 MG tablet TAKE 2 TABLETS BY MOUTH ONCE DAILY 180 tablet 3   atorvastatin (LIPITOR) 40 MG tablet Take 40 mg by mouth every morning.      atorvastatin (LIPITOR) 40 MG tablet TAKE 1 TABLET BY MOUTH ONCE DAILY 90 tablet 1   atorvastatin (LIPITOR) 40 MG tablet Take 1 tablet by mouth once a day 90 tablet 3   cephALEXin (KEFLEX) 500 MG capsule Take 1 capsule (500 mg total) by mouth 2 (two) times daily. 20 capsule 0    colesevelam (WELCHOL) 625 MG tablet TAKE 2 TABLETS BY MOUTH TWO TIMES DAILY * PATIENT NEEDS APPOINTMENT 120 tablet 1   doxycycline (VIBRAMYCIN) 100 MG capsule Take 1 capsule by mouth twice daily for 7 days 14 capsule 0   ibuprofen (ADVIL,MOTRIN) 200 MG tablet Take 600 mg by mouth every 6 (six) hours as needed for moderate pain.      lisinopril (PRINIVIL,ZESTRIL) 5 MG tablet Take 5 mg by mouth every morning.      lisinopril (ZESTRIL) 5 MG tablet TAKE 1 TABLET BY MOUTH ONCE DAILY 90 tablet 2   metFORMIN (GLUCOPHAGE) 500 MG tablet Take 500-1,000 mg by mouth 2 (two) times daily with a meal. 1 tablet in the morning and 2 in the afternoon     metFORMIN (GLUCOPHAGE) 500 MG tablet TAKE 1 TABLET BY MOUTH IN THE MORNING AND 2 TABLETS IN THE EVENING. 270 tablet 2   metFORMIN (GLUCOPHAGE) 500 MG tablet TAKE 1 TABLET BY MOUTH IN THE MORNING AND 2 TABLETS IN THE EVENING 270 tablet 3   sertraline (ZOLOFT) 100 MG tablet Take 200 mg by mouth daily.     No facility-administered medications prior to visit.    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   Anxiety    Arthritis    both shoulders   Complication of anesthesia  trouble waking up   Depression    Dermatochalasis    w/ptosis   Diabetes mellitus without complication ([place])    takes lisinopril to protect kidneys   Headache(784.0)    hx of migraines   Hyperlipidemia    Sleep apnea    CPAP nightly   Tendonitis, Achilles, right     PAST SURGICAL HISTORY: Past Surgical History:  Procedure Laterality Date     c sections     x 2   ABLATION     uteres   BILATERAL TEMPOROMANDIBULAR JOINT ARTHROPLASTY  age 63   BREAST BIOPSY Right yrs ago   CARPAL TUNNEL RELEASE Bilateral    multiple times both hands   CHOLECYSTECTOMY  yrs ago   COLONOSCOPY N/A 12/06/2013   Procedure: COLONOSCOPY;  Surgeon: Cleotis Nipper, MD;  Location: [facility];  Service: Endoscopy;   Laterality: N/A;   cyst removed from hand  yrs ago   [place]  yrs ago   [place] Right 07/09/2015   Procedure: ACHILLES DEBRIDEMENT AND RECONSTRUCTION; EXCISION OF HAGLUND DEFORMITY;  Surgeon: Wylene Simmer, MD;  Location: [place];  Service: Orthopedics;  Laterality: Right;   GASTROCNEMIUS RECESSION Right 07/09/2015   Procedure: RIGHT GASTROC RECESSION ;  Surgeon: Wylene Simmer, MD;  Location: [place];  Service: Orthopedics;  Laterality: Right;   LUNG BIOPSY  age 7    FAMILY HISTORY: Family History  Problem Relation Age of Onset   Breast cancer Mother     SOCIAL HISTORY: Social History   Socioeconomic History   Marital status: Widowed    Spouse name: Not on file   Number of children: 2   Years of education: Not on file   Highest education level: Not on file  Occupational History    Comment: OR  Tobacco Use   Smoking status: Former    Types: Cigarettes    Quit date: 12/06/1985    Years since quitting: 35.7   Smokeless tobacco: Never  Vaping Use   Vaping Use: Never used  Substance and Sexual Activity   Alcohol use: No   Drug use: No   Sexual activity: Not on file  Other Topics Concern   Not on file  Social History Narrative   Lives alone   Social Determinants of Health   Financial Resource Strain: Not on file  Food Insecurity: Not on file  Transportation Needs: Not on file  Physical Activity: Not on file  Stress: Not on file  Social Connections: Not on file  Intimate Partner Violence: Not on file     PHYSICAL EXAM  GENERAL EXAM/CONSTITUTIONAL: Vitals:  Vitals:   08/25/21 0822  BP: 124/70  Pulse: 64  Weight: 225 lb 9.6 oz (102.3 kg)  Height: '5\' 4"'$  (1.626 m)   Body mass index is 38.72 kg/m. Wt Readings from Last 3 Encounters:  08/25/21 225 lb 9.6 oz (102.3 kg)  07/30/20 228 lb (103.4 kg)  03/30/16 237 lb (107.5 kg)   Patient is in no distress;  well developed, nourished and groomed; neck is supple  CARDIOVASCULAR: Examination of carotid arteries is normal; no carotid bruits Regular rate and rhythm, no murmurs Examination of peripheral vascular system by observation and palpation is normal  EYES: Ophthalmoscopic exam of optic discs and posterior segments is normal; no papilledema or hemorrhages No results found.  MUSCULOSKELETAL: Gait, strength, tone, movements noted in Neurologic exam below  NEUROLOGIC: MENTAL STATUS:  No flowsheet data  found. awake, alert, oriented to person, place and time recent and remote memory intact normal attention and concentration language fluent, comprehension intact, naming intact fund of knowledge appropriate  CRANIAL NERVE:  2nd - no papilledema on fundoscopic exam 2nd, 3rd, 4th, 6th - pupils equal and reactive to light, visual fields full to confrontation, extraocular muscles intact, no nystagmus; BILATERAL PARTIAL PTOSIS 5th - facial sensation symmetric 7th - facial strength symmetric 8th - hearing intact 9th - palate elevates symmetrically, uvula midline 11th - shoulder shrug symmetric 12th - tongue protrusion midline  MOTOR:  normal bulk and tone, full strength in the BUE, BLE  SENSORY:  normal and symmetric to light touch, temperature, vibration  COORDINATION:  finger-nose-finger, fine finger movements normal  REFLEXES:  deep tendon reflexes present and symmetric  GAIT/STATION:  narrow based gait; able to walk on toes, heels and tandem; romberg is negative     DIAGNOSTIC DATA (LABS, IMAGING, TESTING) - I reviewed patient records, labs, notes, testing and imaging myself where available.  Lab Results  Component Value Date   WBC 7.2 11/19/2015   HGB 11.6 (L) 11/19/2015   HCT 36.5 11/19/2015   MCV 84.9 11/19/2015   PLT 248 11/19/2015      Component Value Date/Time   NA 137 11/19/2015 0116   K 4.4 11/19/2015 0116   CL 108 11/19/2015 0116   CO2 22 11/19/2015  0116   GLUCOSE 125 (H) 11/19/2015 0116   BUN 23 (H) 11/19/2015 0116   CREATININE 0.80 11/19/2015 0127   CALCIUM 9.1 11/19/2015 0116   PROT 6.6 10/15/2007 1335   ALBUMIN 3.4 (L) 10/15/2007 1335   AST 27 10/15/2007 1335   ALT 25 10/15/2007 1335   ALKPHOS 67 10/15/2007 1335   BILITOT 0.6 10/15/2007 1335   GFRNONAA >60 11/19/2015 0116   GFRAA >60 11/19/2015 0116   No results found for: CHOL, HDL, LDLCALC, LDLDIRECT, TRIG, CHOLHDL Lab Results  Component Value Date   HGBA1C 6.3 11/02/2015   No results found for: VITAMINB12 No results found for: TSH     ASSESSMENT AND PLAN  66 y.o. year old female here with chronic bilateral ptosis since 2013, gradually progressive.   Dx:  1. Ptosis of both eyelids      PLAN:  CHRONIC PROGRESSIVE PTOSIS - check labs to rule out neuromuscular causes  Orders Placed This Encounter  Procedures   AChR Abs with Reflex to MuSK   CBC with Differential/Platelet   Comprehensive metabolic panel   Hemoglobin A1c   TSH   CK   Aldolase   Return for pending if symptoms worsen or fail to improve, pending test results.    Penni Bombard, MD 02/19/6383, 6:65 AM Certified in Neurology, Neurophysiology and Neuroimaging  [facility] [address] [place], [place] [address] [phone]

## 2021-08-27 ENCOUNTER — Other Ambulatory Visit (HOSPITAL_COMMUNITY): Payer: Self-pay

## 2021-09-06 ENCOUNTER — Ambulatory Visit
Admission: RE | Admit: 2021-09-06 | Discharge: 2021-09-06 | Disposition: A | Discharge status: Home / Self Care | Payer: 59 | Source: Ambulatory Visit

## 2021-09-06 ENCOUNTER — Other Ambulatory Visit (HOSPITAL_COMMUNITY): Payer: Self-pay

## 2021-09-06 DIAGNOSIS — Z1231 Encounter for screening mammogram for malignant neoplasm of breast: Secondary | ICD-10-CM | POA: Diagnosis not present

## 2021-09-08 ENCOUNTER — Other Ambulatory Visit: Payer: Self-pay | Admitting: Family Medicine

## 2021-09-08 DIAGNOSIS — R928 Other abnormal and inconclusive findings on diagnostic imaging of breast: Secondary | ICD-10-CM

## 2021-09-08 LAB — MUSK ANTIBODIES: MuSK Antibodies: <1 U/mL

## 2021-09-08 LAB — COMPREHENSIVE METABOLIC PANEL
ALT: 27 IU/L (ref 0–32)
AST: 24 IU/L (ref 0–40)
Albumin/Globulin Ratio: 1.8 (ref 1.2–2.2)
Albumin: 4.6 g/dL (ref 3.8–4.8)
Alkaline Phosphatase: 104 IU/L (ref 44–121)
BUN/Creatinine Ratio: 26 (ref 12–28)
BUN: 21 mg/dL (ref 8–27)
Bilirubin Total: 0.4 mg/dL (ref 0.0–1.2)
CO2: 23 mmol/L (ref 20–29)
Calcium: 9.9 mg/dL (ref 8.7–10.3)
Chloride: 100 mmol/L (ref 96–106)
Creatinine, Ser: 0.82 mg/dL (ref 0.57–1.00)
Globulin, Total: 2.6 g/dL (ref 1.5–4.5)
Glucose: 94 mg/dL (ref 70–99)
Potassium: 4.7 mmol/L (ref 3.5–5.2)
Sodium: 137 mmol/L (ref 134–144)
Total Protein: 7.2 g/dL (ref 6.0–8.5)
eGFR: 80 mL/min/{1.73_m2} (ref >59)

## 2021-09-08 LAB — ACHR ABS WITH REFLEX TO MUSK: AChR Binding Ab, Serum: <0.03 nmol/L (ref 0.00–0.24)

## 2021-09-08 LAB — ALDOLASE: Aldolase: 5.5 U/L (ref 3.3–10.3)

## 2021-09-08 LAB — CBC WITH DIFFERENTIAL/PLATELET
Basophils Absolute: 0 10*3/uL (ref 0.0–0.2)
Basos: 1 %
EOS (ABSOLUTE): 0.3 10*3/uL (ref 0.0–0.4)
Eos: 5 %
Hematocrit: 40.7 % (ref 34.0–46.6)
Hemoglobin: 13.2 g/dL (ref 11.1–15.9)
Immature Grans (Abs): 0 10*3/uL (ref 0.0–0.1)
Immature Granulocytes: 0 %
Lymphocytes Absolute: 1.3 10*3/uL (ref 0.7–3.1)
Lymphs: 22 %
MCH: 28.1 pg (ref 26.6–33.0)
MCHC: 32.4 g/dL (ref 31.5–35.7)
MCV: 87 fL (ref 79–97)
Monocytes Absolute: 0.5 10*3/uL (ref 0.1–0.9)
Monocytes: 8 %
Neutrophils Absolute: 3.9 10*3/uL (ref 1.4–7.0)
Neutrophils: 64 %
Platelets: 265 10*3/uL (ref 150–450)
RBC: 4.69 x10E6/uL (ref 3.77–5.28)
RDW: 13.6 % (ref 11.7–15.4)
WBC: 6 10*3/uL (ref 3.4–10.8)

## 2021-09-08 LAB — HEMOGLOBIN A1C
Est. average glucose Bld gHb Est-mCnc: 134 mg/dL
Hgb A1c MFr Bld: 6.3 % — ABNORMAL HIGH (ref 4.8–5.6)

## 2021-09-08 LAB — CK: Total CK: 62 U/L (ref 32–182)

## 2021-09-08 LAB — TSH: TSH: 2.74 u[IU]/mL (ref 0.450–4.500)

## 2021-09-15 ENCOUNTER — Ambulatory Visit: Payer: 59 | Admitting: Diagnostic Neuroimaging

## 2021-09-20 ENCOUNTER — Other Ambulatory Visit (HOSPITAL_COMMUNITY): Payer: Self-pay

## 2021-09-23 ENCOUNTER — Ambulatory Visit
Admission: RE | Admit: 2021-09-23 | Discharge: 2021-09-23 | Disposition: A | Discharge status: Home / Self Care | Payer: 59 | Source: Ambulatory Visit | Attending: Family Medicine | Admitting: Family Medicine

## 2021-09-23 ENCOUNTER — Other Ambulatory Visit: Payer: Self-pay | Admitting: Family Medicine

## 2021-09-23 DIAGNOSIS — R928 Other abnormal and inconclusive findings on diagnostic imaging of breast: Secondary | ICD-10-CM

## 2021-09-23 DIAGNOSIS — N6313 Unspecified lump in the right breast, lower outer quadrant: Secondary | ICD-10-CM | POA: Diagnosis not present

## 2021-09-23 DIAGNOSIS — N6310 Unspecified lump in the right breast, unspecified quadrant: Secondary | ICD-10-CM

## 2021-09-30 ENCOUNTER — Ambulatory Visit
Admission: RE | Admit: 2021-09-30 | Discharge: 2021-09-30 | Disposition: A | Discharge status: Home / Self Care | Payer: 59 | Source: Ambulatory Visit | Attending: Family Medicine | Admitting: Family Medicine

## 2021-09-30 DIAGNOSIS — N6313 Unspecified lump in the right breast, lower outer quadrant: Secondary | ICD-10-CM | POA: Diagnosis not present

## 2021-09-30 DIAGNOSIS — N6310 Unspecified lump in the right breast, unspecified quadrant: Secondary | ICD-10-CM

## 2021-09-30 DIAGNOSIS — N641 Fat necrosis of breast: Secondary | ICD-10-CM | POA: Diagnosis not present

## 2021-09-30 HISTORY — PX: BREAST BIOPSY: SHX20

## 2021-11-19 DIAGNOSIS — G4733 Obstructive sleep apnea (adult) (pediatric): Secondary | ICD-10-CM | POA: Diagnosis not present

## 2021-11-19 DIAGNOSIS — Z0000 Encounter for general adult medical examination without abnormal findings: Secondary | ICD-10-CM | POA: Diagnosis not present

## 2021-11-19 DIAGNOSIS — H5203 Hypermetropia, bilateral: Secondary | ICD-10-CM | POA: Diagnosis not present

## 2021-11-19 DIAGNOSIS — Z7984 Long term (current) use of oral hypoglycemic drugs: Secondary | ICD-10-CM | POA: Diagnosis not present

## 2021-11-19 DIAGNOSIS — Z86010 Personal history of colonic polyps: Secondary | ICD-10-CM | POA: Diagnosis not present

## 2021-11-19 DIAGNOSIS — E118 Type 2 diabetes mellitus with unspecified complications: Secondary | ICD-10-CM | POA: Diagnosis not present

## 2021-11-19 DIAGNOSIS — Z13220 Encounter for screening for lipoid disorders: Secondary | ICD-10-CM | POA: Diagnosis not present

## 2021-11-19 DIAGNOSIS — E782 Mixed hyperlipidemia: Secondary | ICD-10-CM | POA: Diagnosis not present

## 2021-11-19 DIAGNOSIS — E119 Type 2 diabetes mellitus without complications: Secondary | ICD-10-CM | POA: Diagnosis not present

## 2021-11-19 DIAGNOSIS — Z6838 Body mass index (BMI) 38.0-38.9, adult: Secondary | ICD-10-CM | POA: Diagnosis not present

## 2021-11-19 DIAGNOSIS — I10 Essential (primary) hypertension: Secondary | ICD-10-CM | POA: Diagnosis not present

## 2021-11-22 ENCOUNTER — Other Ambulatory Visit (HOSPITAL_COMMUNITY): Payer: Self-pay

## 2021-11-22 MED ORDER — SERTRALINE HCL 100 MG PO TABS
ORAL_TABLET | ORAL | 2 refills | Status: AC
  Filled 2021-11-22 – 2022-06-14 (×3): qty 90, 90d supply, fill #0

## 2021-11-22 MED ORDER — NEOMYCIN-POLYMYXIN-DEXAMETH 3.5-10000-0.1 OP OINT
TOPICAL_OINTMENT | OPHTHALMIC | 0 refills | Status: AC
Start: 2021-11-22 — End: ?
  Filled 2021-11-22: qty 3.5, 3d supply, fill #0

## 2021-11-29 ENCOUNTER — Other Ambulatory Visit (HOSPITAL_COMMUNITY): Payer: Self-pay

## 2021-11-29 ENCOUNTER — Other Ambulatory Visit: Payer: Self-pay | Admitting: Ophthalmology

## 2021-11-29 DIAGNOSIS — H02413 Mechanical ptosis of bilateral eyelids: Secondary | ICD-10-CM | POA: Diagnosis not present

## 2021-11-29 DIAGNOSIS — H02831 Dermatochalasis of right upper eyelid: Secondary | ICD-10-CM | POA: Diagnosis not present

## 2021-11-29 DIAGNOSIS — H53453 Other localized visual field defect, bilateral: Secondary | ICD-10-CM | POA: Diagnosis not present

## 2021-11-29 DIAGNOSIS — D485 Neoplasm of uncertain behavior of skin: Secondary | ICD-10-CM | POA: Diagnosis not present

## 2021-11-29 DIAGNOSIS — H04222 Epiphora due to insufficient drainage, left lacrimal gland: Secondary | ICD-10-CM | POA: Diagnosis not present

## 2021-11-29 DIAGNOSIS — H02834 Dermatochalasis of left upper eyelid: Secondary | ICD-10-CM | POA: Diagnosis not present

## 2021-11-29 DIAGNOSIS — H53483 Generalized contraction of visual field, bilateral: Secondary | ICD-10-CM | POA: Diagnosis not present

## 2021-11-29 DIAGNOSIS — H57813 Brow ptosis, bilateral: Secondary | ICD-10-CM | POA: Diagnosis not present

## 2021-11-29 DIAGNOSIS — H04562 Stenosis of left lacrimal punctum: Secondary | ICD-10-CM | POA: Diagnosis not present

## 2021-11-29 DIAGNOSIS — D23122 Other benign neoplasm of skin of left lower eyelid, including canthus: Secondary | ICD-10-CM | POA: Diagnosis not present

## 2021-12-13 ENCOUNTER — Other Ambulatory Visit (HOSPITAL_COMMUNITY): Payer: Self-pay

## 2021-12-13 MED ORDER — SERTRALINE HCL 100 MG PO TABS
ORAL_TABLET | ORAL | 0 refills | Status: AC
  Filled 2021-12-13: qty 90, 90d supply, fill #0

## 2021-12-14 ENCOUNTER — Other Ambulatory Visit (HOSPITAL_COMMUNITY): Payer: Self-pay

## 2021-12-16 ENCOUNTER — Other Ambulatory Visit (HOSPITAL_COMMUNITY): Payer: Self-pay

## 2021-12-16 MED ORDER — SERTRALINE HCL 100 MG PO TABS
ORAL_TABLET | ORAL | 1 refills | Status: DC
  Filled 2021-12-16: qty 180, 90d supply, fill #0
  Filled 2022-02-15 – 2022-02-28 (×2): qty 180, 90d supply, fill #1

## 2021-12-24 DIAGNOSIS — H25813 Combined forms of age-related cataract, bilateral: Secondary | ICD-10-CM | POA: Diagnosis not present

## 2021-12-24 DIAGNOSIS — H02831 Dermatochalasis of right upper eyelid: Secondary | ICD-10-CM | POA: Diagnosis not present

## 2021-12-24 DIAGNOSIS — H02834 Dermatochalasis of left upper eyelid: Secondary | ICD-10-CM | POA: Diagnosis not present

## 2021-12-27 DIAGNOSIS — H04123 Dry eye syndrome of bilateral lacrimal glands: Secondary | ICD-10-CM | POA: Diagnosis not present

## 2021-12-27 DIAGNOSIS — H2513 Age-related nuclear cataract, bilateral: Secondary | ICD-10-CM | POA: Diagnosis not present

## 2022-02-14 DIAGNOSIS — H04123 Dry eye syndrome of bilateral lacrimal glands: Secondary | ICD-10-CM | POA: Diagnosis not present

## 2022-02-14 DIAGNOSIS — H2513 Age-related nuclear cataract, bilateral: Secondary | ICD-10-CM | POA: Diagnosis not present

## 2022-02-15 ENCOUNTER — Other Ambulatory Visit (HOSPITAL_COMMUNITY): Payer: Self-pay

## 2022-02-28 ENCOUNTER — Other Ambulatory Visit (HOSPITAL_COMMUNITY): Payer: Self-pay

## 2022-03-03 ENCOUNTER — Other Ambulatory Visit (HOSPITAL_COMMUNITY): Payer: Self-pay

## 2022-03-16 ENCOUNTER — Other Ambulatory Visit (HOSPITAL_COMMUNITY): Payer: Self-pay

## 2022-03-19 ENCOUNTER — Other Ambulatory Visit (HOSPITAL_COMMUNITY): Payer: Self-pay

## 2022-04-04 ENCOUNTER — Other Ambulatory Visit (HOSPITAL_COMMUNITY): Payer: Self-pay

## 2022-04-18 ENCOUNTER — Other Ambulatory Visit (HOSPITAL_COMMUNITY): Payer: Self-pay

## 2022-04-27 ENCOUNTER — Other Ambulatory Visit (HOSPITAL_COMMUNITY): Payer: Self-pay

## 2022-04-29 ENCOUNTER — Encounter (HOSPITAL_COMMUNITY): Payer: Self-pay

## 2022-04-29 ENCOUNTER — Other Ambulatory Visit (HOSPITAL_COMMUNITY): Payer: Self-pay

## 2022-05-02 ENCOUNTER — Other Ambulatory Visit (HOSPITAL_COMMUNITY): Payer: Self-pay

## 2022-05-06 ENCOUNTER — Other Ambulatory Visit (HOSPITAL_COMMUNITY): Payer: Self-pay

## 2022-05-06 MED ORDER — COLESEVELAM HCL 625 MG PO TABS
1250.0000 mg | ORAL_TABLET | Freq: Two times a day (BID) | ORAL | 0 refills | Status: DC
  Filled 2022-05-06 – 2022-06-14 (×5): qty 360, 90d supply, fill #0

## 2022-05-16 ENCOUNTER — Other Ambulatory Visit (HOSPITAL_COMMUNITY): Payer: Self-pay

## 2022-05-16 MED ORDER — METFORMIN HCL 500 MG PO TABS
ORAL_TABLET | ORAL | 1 refills | Status: DC
  Filled 2022-05-16: qty 270, 90d supply, fill #0
  Filled 2022-07-29 – 2022-08-10 (×2): qty 270, 90d supply, fill #1

## 2022-05-16 MED ORDER — LISINOPRIL 5 MG PO TABS
5.0000 mg | ORAL_TABLET | Freq: Every day | ORAL | 1 refills | Status: AC
  Filled 2022-05-16: qty 90, 90d supply, fill #0
  Filled 2023-02-04: qty 90, 90d supply, fill #1

## 2022-06-03 ENCOUNTER — Other Ambulatory Visit: Payer: Self-pay

## 2022-06-03 ENCOUNTER — Other Ambulatory Visit (HOSPITAL_COMMUNITY): Payer: Self-pay

## 2022-06-03 DIAGNOSIS — I10 Essential (primary) hypertension: Secondary | ICD-10-CM | POA: Diagnosis not present

## 2022-06-03 DIAGNOSIS — M2550 Pain in unspecified joint: Secondary | ICD-10-CM | POA: Diagnosis not present

## 2022-06-03 DIAGNOSIS — Z6837 Body mass index (BMI) 37.0-37.9, adult: Secondary | ICD-10-CM | POA: Diagnosis not present

## 2022-06-03 DIAGNOSIS — L089 Local infection of the skin and subcutaneous tissue, unspecified: Secondary | ICD-10-CM | POA: Diagnosis not present

## 2022-06-03 DIAGNOSIS — R809 Proteinuria, unspecified: Secondary | ICD-10-CM | POA: Diagnosis not present

## 2022-06-03 DIAGNOSIS — E118 Type 2 diabetes mellitus with unspecified complications: Secondary | ICD-10-CM | POA: Diagnosis not present

## 2022-06-03 MED ORDER — DOXYCYCLINE HYCLATE 100 MG PO CAPS
100.0000 mg | ORAL_CAPSULE | Freq: Two times a day (BID) | ORAL | 0 refills | Status: AC
  Filled 2022-06-03 (×2): qty 14, 7d supply, fill #0

## 2022-06-14 ENCOUNTER — Other Ambulatory Visit (HOSPITAL_BASED_OUTPATIENT_CLINIC_OR_DEPARTMENT_OTHER): Payer: Self-pay

## 2022-06-14 ENCOUNTER — Other Ambulatory Visit: Payer: Self-pay

## 2022-06-14 ENCOUNTER — Other Ambulatory Visit (HOSPITAL_COMMUNITY): Payer: Self-pay

## 2022-06-15 ENCOUNTER — Other Ambulatory Visit (HOSPITAL_COMMUNITY): Payer: Self-pay

## 2022-06-15 ENCOUNTER — Other Ambulatory Visit: Payer: Self-pay

## 2022-07-26 ENCOUNTER — Other Ambulatory Visit: Payer: Self-pay | Admitting: Family Medicine

## 2022-07-26 DIAGNOSIS — Z1231 Encounter for screening mammogram for malignant neoplasm of breast: Secondary | ICD-10-CM

## 2022-07-29 ENCOUNTER — Other Ambulatory Visit (HOSPITAL_COMMUNITY): Payer: Self-pay

## 2022-07-29 MED ORDER — LISINOPRIL 5 MG PO TABS
5.0000 mg | ORAL_TABLET | Freq: Every day | ORAL | 1 refills | Status: DC
  Filled 2022-07-29 – 2022-08-10 (×2): qty 90, 90d supply, fill #0
  Filled 2022-11-09: qty 90, 90d supply, fill #1

## 2022-08-01 ENCOUNTER — Other Ambulatory Visit: Payer: Self-pay | Admitting: Family Medicine

## 2022-08-01 DIAGNOSIS — Z1231 Encounter for screening mammogram for malignant neoplasm of breast: Secondary | ICD-10-CM

## 2022-08-02 ENCOUNTER — Other Ambulatory Visit (HOSPITAL_COMMUNITY): Payer: Self-pay

## 2022-08-09 ENCOUNTER — Other Ambulatory Visit (HOSPITAL_COMMUNITY): Payer: Self-pay

## 2022-08-10 ENCOUNTER — Other Ambulatory Visit (HOSPITAL_COMMUNITY): Payer: Self-pay

## 2022-08-10 ENCOUNTER — Other Ambulatory Visit: Payer: Self-pay

## 2022-08-11 ENCOUNTER — Other Ambulatory Visit (HOSPITAL_COMMUNITY): Payer: Self-pay

## 2022-08-15 ENCOUNTER — Other Ambulatory Visit (HOSPITAL_COMMUNITY): Payer: Self-pay

## 2022-08-15 MED ORDER — SERTRALINE HCL 100 MG PO TABS
200.0000 mg | ORAL_TABLET | Freq: Every day | ORAL | 0 refills | Status: DC
  Filled 2022-08-17 – 2022-08-22 (×3): qty 180, 90d supply, fill #0

## 2022-08-16 ENCOUNTER — Other Ambulatory Visit (HOSPITAL_COMMUNITY): Payer: Self-pay

## 2022-08-16 ENCOUNTER — Other Ambulatory Visit: Payer: Self-pay

## 2022-08-16 MED ORDER — ATORVASTATIN CALCIUM 40 MG PO TABS
40.0000 mg | ORAL_TABLET | Freq: Every day | ORAL | 1 refills | Status: DC
  Filled 2022-08-16: qty 90, 90d supply, fill #0
  Filled 2022-11-09: qty 90, 90d supply, fill #1

## 2022-08-17 ENCOUNTER — Other Ambulatory Visit: Payer: Self-pay

## 2022-08-21 ENCOUNTER — Other Ambulatory Visit (HOSPITAL_COMMUNITY): Payer: Self-pay

## 2022-08-22 ENCOUNTER — Other Ambulatory Visit (HOSPITAL_COMMUNITY): Payer: Self-pay

## 2022-08-22 DIAGNOSIS — J101 Influenza due to other identified influenza virus with other respiratory manifestations: Secondary | ICD-10-CM | POA: Diagnosis not present

## 2022-08-22 DIAGNOSIS — Z20822 Contact with and (suspected) exposure to covid-19: Secondary | ICD-10-CM | POA: Diagnosis not present

## 2022-08-22 DIAGNOSIS — J029 Acute pharyngitis, unspecified: Secondary | ICD-10-CM | POA: Diagnosis not present

## 2022-08-22 DIAGNOSIS — R059 Cough, unspecified: Secondary | ICD-10-CM | POA: Diagnosis not present

## 2022-08-22 DIAGNOSIS — R0602 Shortness of breath: Secondary | ICD-10-CM | POA: Diagnosis not present

## 2022-08-22 MED ORDER — ALBUTEROL SULFATE HFA 108 (90 BASE) MCG/ACT IN AERS
INHALATION_SPRAY | RESPIRATORY_TRACT | 0 refills | Status: AC
  Filled 2022-08-22: qty 6.7, 25d supply, fill #0

## 2022-08-23 ENCOUNTER — Other Ambulatory Visit (HOSPITAL_COMMUNITY): Payer: Self-pay

## 2022-08-23 MED ORDER — BENZONATATE 200 MG PO CAPS
200.0000 mg | ORAL_CAPSULE | Freq: Three times a day (TID) | ORAL | 0 refills | Status: AC | PRN
  Filled 2022-08-23: qty 30, 10d supply, fill #0

## 2022-08-31 ENCOUNTER — Other Ambulatory Visit: Payer: Self-pay

## 2022-09-09 ENCOUNTER — Other Ambulatory Visit (HOSPITAL_COMMUNITY): Payer: Self-pay

## 2022-09-16 DIAGNOSIS — Z1231 Encounter for screening mammogram for malignant neoplasm of breast: Secondary | ICD-10-CM

## 2022-09-23 ENCOUNTER — Ambulatory Visit
Admission: RE | Admit: 2022-09-23 | Discharge: 2022-09-23 | Disposition: A | Discharge status: Home / Self Care | Payer: Commercial Managed Care - PPO | Source: Ambulatory Visit | Attending: Family Medicine | Admitting: Family Medicine

## 2022-09-23 DIAGNOSIS — Z1231 Encounter for screening mammogram for malignant neoplasm of breast: Secondary | ICD-10-CM

## 2022-09-27 ENCOUNTER — Other Ambulatory Visit: Payer: Self-pay

## 2022-10-19 ENCOUNTER — Other Ambulatory Visit (HOSPITAL_COMMUNITY): Payer: Self-pay

## 2022-11-07 ENCOUNTER — Other Ambulatory Visit (HOSPITAL_COMMUNITY): Payer: Self-pay

## 2022-11-09 ENCOUNTER — Other Ambulatory Visit (HOSPITAL_COMMUNITY): Payer: Self-pay

## 2022-11-09 ENCOUNTER — Other Ambulatory Visit: Payer: Self-pay

## 2022-11-16 ENCOUNTER — Other Ambulatory Visit (HOSPITAL_COMMUNITY): Payer: Self-pay

## 2022-11-16 MED ORDER — SERTRALINE HCL 100 MG PO TABS
200.0000 mg | ORAL_TABLET | Freq: Every day | ORAL | 0 refills | Status: DC
  Filled 2022-11-16: qty 180, 90d supply, fill #0

## 2022-11-18 ENCOUNTER — Other Ambulatory Visit (HOSPITAL_COMMUNITY): Payer: Self-pay

## 2022-11-28 ENCOUNTER — Other Ambulatory Visit (HOSPITAL_COMMUNITY): Payer: Self-pay

## 2022-11-29 ENCOUNTER — Other Ambulatory Visit (HOSPITAL_COMMUNITY): Payer: Self-pay

## 2022-11-29 DIAGNOSIS — M25511 Pain in right shoulder: Secondary | ICD-10-CM | POA: Diagnosis not present

## 2022-11-29 DIAGNOSIS — K760 Fatty (change of) liver, not elsewhere classified: Secondary | ICD-10-CM | POA: Diagnosis not present

## 2022-11-29 DIAGNOSIS — M25562 Pain in left knee: Secondary | ICD-10-CM | POA: Diagnosis not present

## 2022-11-29 DIAGNOSIS — I10 Essential (primary) hypertension: Secondary | ICD-10-CM | POA: Diagnosis not present

## 2022-11-29 DIAGNOSIS — E118 Type 2 diabetes mellitus with unspecified complications: Secondary | ICD-10-CM | POA: Diagnosis not present

## 2022-11-29 DIAGNOSIS — T753XXA Motion sickness, initial encounter: Secondary | ICD-10-CM | POA: Diagnosis not present

## 2022-11-29 DIAGNOSIS — M2550 Pain in unspecified joint: Secondary | ICD-10-CM | POA: Diagnosis not present

## 2022-11-29 DIAGNOSIS — Z13220 Encounter for screening for lipoid disorders: Secondary | ICD-10-CM | POA: Diagnosis not present

## 2022-11-29 DIAGNOSIS — Z0000 Encounter for general adult medical examination without abnormal findings: Secondary | ICD-10-CM | POA: Diagnosis not present

## 2022-11-29 DIAGNOSIS — R809 Proteinuria, unspecified: Secondary | ICD-10-CM | POA: Diagnosis not present

## 2022-11-29 DIAGNOSIS — M25512 Pain in left shoulder: Secondary | ICD-10-CM | POA: Diagnosis not present

## 2022-11-29 DIAGNOSIS — E782 Mixed hyperlipidemia: Secondary | ICD-10-CM | POA: Diagnosis not present

## 2022-11-29 MED ORDER — SCOPOLAMINE 1 MG/3DAYS TD PT72
MEDICATED_PATCH | TRANSDERMAL | 0 refills | Status: AC
  Filled 2022-11-29: qty 5, 15d supply, fill #0

## 2022-11-29 MED ORDER — OZEMPIC (0.25 OR 0.5 MG/DOSE) 2 MG/3ML ~~LOC~~ SOPN
0.5000 mg | PEN_INJECTOR | SUBCUTANEOUS | 0 refills | Status: DC
  Filled 2022-11-29 – 2022-12-06 (×2): qty 3, 28d supply, fill #0
  Filled 2022-12-27 – 2022-12-28 (×2): qty 3, 28d supply, fill #1
  Filled 2023-01-24 – 2023-01-26 (×2): qty 3, 28d supply, fill #2

## 2022-11-30 ENCOUNTER — Other Ambulatory Visit (HOSPITAL_COMMUNITY): Payer: Self-pay

## 2022-12-05 ENCOUNTER — Other Ambulatory Visit (HOSPITAL_COMMUNITY): Payer: Self-pay

## 2022-12-06 ENCOUNTER — Other Ambulatory Visit (HOSPITAL_COMMUNITY): Payer: Self-pay

## 2022-12-06 ENCOUNTER — Other Ambulatory Visit: Payer: Self-pay

## 2022-12-06 MED ORDER — ATORVASTATIN CALCIUM 40 MG PO TABS
40.0000 mg | ORAL_TABLET | Freq: Every day | ORAL | 0 refills | Status: DC
  Filled 2023-02-06: qty 90, 90d supply, fill #0

## 2022-12-07 ENCOUNTER — Other Ambulatory Visit (HOSPITAL_COMMUNITY): Payer: Self-pay

## 2022-12-08 ENCOUNTER — Other Ambulatory Visit (HOSPITAL_COMMUNITY): Payer: Self-pay

## 2022-12-12 ENCOUNTER — Other Ambulatory Visit (HOSPITAL_COMMUNITY): Payer: Self-pay

## 2022-12-12 MED ORDER — METFORMIN HCL 500 MG PO TABS
500.0000 mg | ORAL_TABLET | ORAL | 1 refills | Status: DC
  Filled 2022-12-12: qty 360, 90d supply, fill #0

## 2022-12-13 ENCOUNTER — Other Ambulatory Visit: Payer: Self-pay

## 2022-12-13 ENCOUNTER — Other Ambulatory Visit (HOSPITAL_COMMUNITY): Payer: Self-pay

## 2022-12-13 MED ORDER — METFORMIN HCL 500 MG PO TABS
ORAL_TABLET | ORAL | 1 refills | Status: DC
  Filled 2022-12-13: qty 270, 90d supply, fill #0
  Filled 2023-03-06: qty 270, 90d supply, fill #1

## 2022-12-14 ENCOUNTER — Other Ambulatory Visit (HOSPITAL_COMMUNITY): Payer: Self-pay

## 2022-12-15 ENCOUNTER — Other Ambulatory Visit (HOSPITAL_COMMUNITY): Payer: Self-pay

## 2022-12-16 ENCOUNTER — Other Ambulatory Visit (HOSPITAL_COMMUNITY): Payer: Self-pay

## 2022-12-17 ENCOUNTER — Other Ambulatory Visit (HOSPITAL_COMMUNITY): Payer: Self-pay

## 2022-12-20 ENCOUNTER — Other Ambulatory Visit (HOSPITAL_COMMUNITY): Payer: Self-pay

## 2022-12-23 ENCOUNTER — Other Ambulatory Visit (HOSPITAL_COMMUNITY): Payer: Self-pay

## 2022-12-23 DIAGNOSIS — M25512 Pain in left shoulder: Secondary | ICD-10-CM | POA: Diagnosis not present

## 2022-12-23 DIAGNOSIS — M25511 Pain in right shoulder: Secondary | ICD-10-CM | POA: Diagnosis not present

## 2022-12-23 DIAGNOSIS — M25562 Pain in left knee: Secondary | ICD-10-CM | POA: Diagnosis not present

## 2022-12-23 MED ORDER — PREDNISONE 10 MG PO TABS
ORAL_TABLET | ORAL | 0 refills | Status: AC
  Filled 2022-12-23: qty 21, 12d supply, fill #0

## 2022-12-27 ENCOUNTER — Other Ambulatory Visit: Payer: Self-pay

## 2022-12-28 ENCOUNTER — Other Ambulatory Visit: Payer: Self-pay | Admitting: Oncology

## 2022-12-28 DIAGNOSIS — Z006 Encounter for examination for normal comparison and control in clinical research program: Secondary | ICD-10-CM

## 2022-12-29 DIAGNOSIS — H31001 Unspecified chorioretinal scars, right eye: Secondary | ICD-10-CM | POA: Diagnosis not present

## 2022-12-29 DIAGNOSIS — E119 Type 2 diabetes mellitus without complications: Secondary | ICD-10-CM | POA: Diagnosis not present

## 2022-12-29 DIAGNOSIS — H25813 Combined forms of age-related cataract, bilateral: Secondary | ICD-10-CM | POA: Diagnosis not present

## 2022-12-29 DIAGNOSIS — H5203 Hypermetropia, bilateral: Secondary | ICD-10-CM | POA: Diagnosis not present

## 2023-01-02 ENCOUNTER — Other Ambulatory Visit (HOSPITAL_COMMUNITY): Payer: Self-pay

## 2023-01-03 ENCOUNTER — Other Ambulatory Visit (HOSPITAL_COMMUNITY): Payer: Self-pay

## 2023-01-06 DIAGNOSIS — M25562 Pain in left knee: Secondary | ICD-10-CM | POA: Diagnosis not present

## 2023-01-07 ENCOUNTER — Other Ambulatory Visit (HOSPITAL_COMMUNITY): Payer: Self-pay

## 2023-01-07 MED ORDER — COLESEVELAM HCL 625 MG PO TABS
1250.0000 mg | ORAL_TABLET | Freq: Two times a day (BID) | ORAL | 0 refills | Status: DC
  Filled 2023-01-07 – 2023-01-10 (×2): qty 360, 90d supply, fill #0

## 2023-01-09 ENCOUNTER — Other Ambulatory Visit (HOSPITAL_COMMUNITY): Payer: Self-pay

## 2023-01-10 ENCOUNTER — Other Ambulatory Visit: Payer: Self-pay

## 2023-01-20 DIAGNOSIS — M25562 Pain in left knee: Secondary | ICD-10-CM | POA: Diagnosis not present

## 2023-01-25 ENCOUNTER — Other Ambulatory Visit: Payer: Self-pay

## 2023-02-06 ENCOUNTER — Other Ambulatory Visit: Payer: Self-pay

## 2023-02-09 ENCOUNTER — Encounter (HOSPITAL_COMMUNITY): Payer: Self-pay

## 2023-02-09 ENCOUNTER — Other Ambulatory Visit (HOSPITAL_COMMUNITY): Payer: Self-pay

## 2023-02-21 ENCOUNTER — Other Ambulatory Visit (HOSPITAL_COMMUNITY): Payer: Self-pay

## 2023-02-23 ENCOUNTER — Encounter (HOSPITAL_COMMUNITY): Payer: Self-pay

## 2023-02-24 ENCOUNTER — Other Ambulatory Visit (HOSPITAL_COMMUNITY): Payer: Self-pay

## 2023-02-28 ENCOUNTER — Other Ambulatory Visit (HOSPITAL_COMMUNITY): Payer: Self-pay

## 2023-03-01 ENCOUNTER — Other Ambulatory Visit (HOSPITAL_COMMUNITY): Payer: Self-pay

## 2023-03-01 ENCOUNTER — Encounter (HOSPITAL_COMMUNITY): Payer: Self-pay

## 2023-03-01 MED ORDER — PREDNISONE 10 MG PO TABS
ORAL_TABLET | ORAL | 0 refills | Status: AC
  Filled 2023-03-01: qty 21, 12d supply, fill #0

## 2023-03-01 MED ORDER — SERTRALINE HCL 100 MG PO TABS
200.0000 mg | ORAL_TABLET | Freq: Every day | ORAL | 0 refills | Status: DC
  Filled 2023-03-01: qty 180, 90d supply, fill #0

## 2023-03-02 ENCOUNTER — Other Ambulatory Visit (HOSPITAL_COMMUNITY): Payer: Self-pay

## 2023-03-06 ENCOUNTER — Other Ambulatory Visit: Payer: Self-pay

## 2023-03-11 ENCOUNTER — Other Ambulatory Visit (HOSPITAL_COMMUNITY): Payer: Self-pay

## 2023-03-27 ENCOUNTER — Other Ambulatory Visit (HOSPITAL_COMMUNITY): Payer: Self-pay

## 2023-04-03 ENCOUNTER — Other Ambulatory Visit (HOSPITAL_COMMUNITY): Payer: Self-pay

## 2023-04-04 ENCOUNTER — Other Ambulatory Visit: Payer: Self-pay

## 2023-04-04 ENCOUNTER — Other Ambulatory Visit (HOSPITAL_COMMUNITY): Payer: Self-pay

## 2023-04-04 MED ORDER — COLESEVELAM HCL 625 MG PO TABS
1250.0000 mg | ORAL_TABLET | Freq: Two times a day (BID) | ORAL | 0 refills | Status: DC
  Filled 2023-04-04: qty 360, 90d supply, fill #0

## 2023-04-07 ENCOUNTER — Other Ambulatory Visit (HOSPITAL_COMMUNITY): Payer: Self-pay

## 2023-04-25 ENCOUNTER — Other Ambulatory Visit (HOSPITAL_COMMUNITY): Payer: Self-pay

## 2023-04-26 ENCOUNTER — Other Ambulatory Visit: Payer: Self-pay

## 2023-04-26 ENCOUNTER — Other Ambulatory Visit (HOSPITAL_COMMUNITY): Payer: Self-pay

## 2023-04-26 MED ORDER — OZEMPIC (0.25 OR 0.5 MG/DOSE) 2 MG/3ML ~~LOC~~ SOPN
0.5000 mg | PEN_INJECTOR | SUBCUTANEOUS | 0 refills | Status: DC
  Filled 2023-04-26: qty 3, 28d supply, fill #0
  Filled 2023-05-18: qty 3, 28d supply, fill #1
  Filled 2023-06-15: qty 3, 28d supply, fill #2

## 2023-05-01 ENCOUNTER — Other Ambulatory Visit (HOSPITAL_COMMUNITY): Payer: Self-pay

## 2023-05-02 ENCOUNTER — Other Ambulatory Visit (HOSPITAL_COMMUNITY): Payer: Self-pay

## 2023-05-02 ENCOUNTER — Other Ambulatory Visit: Payer: Self-pay

## 2023-05-02 MED ORDER — LISINOPRIL 5 MG PO TABS
5.0000 mg | ORAL_TABLET | Freq: Every day | ORAL | 0 refills | Status: DC
  Filled 2023-05-02: qty 90, 90d supply, fill #0

## 2023-05-09 ENCOUNTER — Other Ambulatory Visit (HOSPITAL_COMMUNITY): Payer: Self-pay

## 2023-05-19 ENCOUNTER — Other Ambulatory Visit: Payer: Self-pay

## 2023-05-19 ENCOUNTER — Other Ambulatory Visit (HOSPITAL_COMMUNITY): Payer: Self-pay

## 2023-05-23 ENCOUNTER — Other Ambulatory Visit (HOSPITAL_COMMUNITY): Payer: Self-pay

## 2023-05-23 ENCOUNTER — Other Ambulatory Visit: Payer: Self-pay

## 2023-05-23 MED ORDER — SERTRALINE HCL 100 MG PO TABS
200.0000 mg | ORAL_TABLET | Freq: Every day | ORAL | 0 refills | Status: DC
  Filled 2023-05-23: qty 180, 90d supply, fill #0

## 2023-05-23 MED ORDER — ATORVASTATIN CALCIUM 40 MG PO TABS
40.0000 mg | ORAL_TABLET | Freq: Every day | ORAL | 1 refills | Status: DC
  Filled 2023-05-23: qty 90, 90d supply, fill #0
  Filled 2023-08-15: qty 90, 90d supply, fill #1

## 2023-05-31 DIAGNOSIS — I10 Essential (primary) hypertension: Secondary | ICD-10-CM | POA: Diagnosis not present

## 2023-05-31 DIAGNOSIS — F3341 Major depressive disorder, recurrent, in partial remission: Secondary | ICD-10-CM | POA: Diagnosis not present

## 2023-05-31 DIAGNOSIS — E782 Mixed hyperlipidemia: Secondary | ICD-10-CM | POA: Diagnosis not present

## 2023-05-31 DIAGNOSIS — K760 Fatty (change of) liver, not elsewhere classified: Secondary | ICD-10-CM | POA: Diagnosis not present

## 2023-05-31 DIAGNOSIS — E118 Type 2 diabetes mellitus with unspecified complications: Secondary | ICD-10-CM | POA: Diagnosis not present

## 2023-06-05 ENCOUNTER — Other Ambulatory Visit (HOSPITAL_COMMUNITY): Payer: Self-pay

## 2023-06-05 ENCOUNTER — Other Ambulatory Visit: Payer: Self-pay

## 2023-06-05 MED ORDER — METFORMIN HCL 500 MG PO TABS
ORAL_TABLET | ORAL | 1 refills | Status: DC
  Filled 2023-06-05: qty 270, 90d supply, fill #0
  Filled 2023-08-28: qty 270, 90d supply, fill #1

## 2023-07-03 ENCOUNTER — Other Ambulatory Visit (HOSPITAL_COMMUNITY): Payer: Self-pay

## 2023-07-07 ENCOUNTER — Other Ambulatory Visit: Payer: Self-pay

## 2023-07-07 ENCOUNTER — Other Ambulatory Visit (HOSPITAL_COMMUNITY): Payer: Self-pay

## 2023-07-07 MED ORDER — COLESEVELAM HCL 625 MG PO TABS
1250.0000 mg | ORAL_TABLET | Freq: Two times a day (BID) | ORAL | 1 refills | Status: AC
  Filled 2023-07-07: qty 360, 90d supply, fill #0

## 2023-07-20 ENCOUNTER — Other Ambulatory Visit (HOSPITAL_COMMUNITY): Payer: Self-pay

## 2023-07-24 ENCOUNTER — Other Ambulatory Visit (HOSPITAL_COMMUNITY): Payer: Self-pay

## 2023-07-25 ENCOUNTER — Other Ambulatory Visit (HOSPITAL_COMMUNITY): Payer: Self-pay

## 2023-07-25 MED ORDER — OZEMPIC (0.25 OR 0.5 MG/DOSE) 2 MG/3ML ~~LOC~~ SOPN
0.5000 mg | PEN_INJECTOR | SUBCUTANEOUS | 0 refills | Status: DC
  Filled 2023-07-25: qty 9, 84d supply, fill #0
  Filled 2023-08-10: qty 3, 28d supply, fill #0
  Filled 2023-08-31: qty 3, 28d supply, fill #1
  Filled 2023-09-28: qty 3, 28d supply, fill #2

## 2023-07-27 ENCOUNTER — Other Ambulatory Visit: Payer: Self-pay

## 2023-07-31 ENCOUNTER — Other Ambulatory Visit (HOSPITAL_COMMUNITY): Payer: Self-pay

## 2023-07-31 ENCOUNTER — Other Ambulatory Visit: Payer: Self-pay

## 2023-07-31 MED ORDER — LISINOPRIL 5 MG PO TABS
5.0000 mg | ORAL_TABLET | Freq: Every day | ORAL | 1 refills | Status: DC
  Filled 2023-07-31: qty 90, 90d supply, fill #0
  Filled 2023-10-27: qty 90, 90d supply, fill #1

## 2023-08-09 ENCOUNTER — Other Ambulatory Visit (HOSPITAL_COMMUNITY): Payer: Self-pay

## 2023-08-09 ENCOUNTER — Other Ambulatory Visit: Payer: Self-pay

## 2023-08-10 ENCOUNTER — Other Ambulatory Visit (HOSPITAL_COMMUNITY): Payer: Self-pay

## 2023-08-10 ENCOUNTER — Other Ambulatory Visit: Payer: Self-pay

## 2023-08-21 ENCOUNTER — Other Ambulatory Visit: Payer: Self-pay

## 2023-08-21 ENCOUNTER — Other Ambulatory Visit (HOSPITAL_COMMUNITY): Payer: Self-pay

## 2023-08-21 MED ORDER — SERTRALINE HCL 100 MG PO TABS
200.0000 mg | ORAL_TABLET | Freq: Every day | ORAL | 1 refills | Status: DC
  Filled 2023-08-21: qty 180, 90d supply, fill #0
  Filled 2023-11-19: qty 180, 90d supply, fill #1

## 2023-08-31 ENCOUNTER — Other Ambulatory Visit: Payer: Self-pay

## 2023-09-01 ENCOUNTER — Other Ambulatory Visit (HOSPITAL_COMMUNITY): Payer: Self-pay

## 2023-09-01 ENCOUNTER — Other Ambulatory Visit: Payer: Self-pay | Admitting: Family Medicine

## 2023-09-01 DIAGNOSIS — Z0000 Encounter for general adult medical examination without abnormal findings: Secondary | ICD-10-CM

## 2023-09-19 ENCOUNTER — Encounter

## 2023-09-22 ENCOUNTER — Encounter

## 2023-09-22 ENCOUNTER — Other Ambulatory Visit (HOSPITAL_COMMUNITY): Payer: Self-pay

## 2023-09-29 ENCOUNTER — Ambulatory Visit
Admission: RE | Admit: 2023-09-29 | Discharge: 2023-09-29 | Disposition: A | Discharge status: Home / Self Care | Payer: Medicare (Managed Care) | Source: Ambulatory Visit | Attending: Family Medicine | Admitting: Family Medicine

## 2023-09-29 ENCOUNTER — Encounter

## 2023-09-29 DIAGNOSIS — Z0000 Encounter for general adult medical examination without abnormal findings: Secondary | ICD-10-CM

## 2023-10-27 ENCOUNTER — Other Ambulatory Visit (HOSPITAL_COMMUNITY): Payer: Self-pay

## 2023-10-30 ENCOUNTER — Encounter: Payer: Self-pay | Admitting: Pharmacist

## 2023-10-30 ENCOUNTER — Other Ambulatory Visit: Payer: Self-pay

## 2023-10-30 ENCOUNTER — Other Ambulatory Visit (HOSPITAL_COMMUNITY): Payer: Self-pay

## 2023-10-30 MED ORDER — OZEMPIC (0.25 OR 0.5 MG/DOSE) 2 MG/3ML ~~LOC~~ SOPN
0.5000 mg | PEN_INJECTOR | SUBCUTANEOUS | 0 refills | Status: AC
Start: 2023-10-30 — End: ?
  Filled 2023-10-30: qty 9, 84d supply, fill #0
  Filled 2023-10-30: qty 3, 28d supply, fill #0
  Filled 2023-11-21: qty 3, 28d supply, fill #1
  Filled 2023-12-19: qty 3, 28d supply, fill #2

## 2023-11-08 ENCOUNTER — Other Ambulatory Visit (HOSPITAL_COMMUNITY): Payer: Medicare (Managed Care)

## 2023-11-14 ENCOUNTER — Other Ambulatory Visit: Payer: Self-pay

## 2023-11-19 ENCOUNTER — Other Ambulatory Visit (HOSPITAL_COMMUNITY): Payer: Self-pay

## 2023-11-20 ENCOUNTER — Other Ambulatory Visit: Payer: Self-pay

## 2023-11-20 ENCOUNTER — Other Ambulatory Visit (HOSPITAL_COMMUNITY): Payer: Self-pay

## 2023-11-20 ENCOUNTER — Other Ambulatory Visit (HOSPITAL_COMMUNITY)
Admission: RE | Admit: 2023-11-20 | Discharge: 2023-11-20 | Disposition: A | Discharge status: Home / Self Care | Payer: Medicare (Managed Care) | Source: Ambulatory Visit | Attending: Oncology | Admitting: Oncology

## 2023-11-20 DIAGNOSIS — Z006 Encounter for examination for normal comparison and control in clinical research program: Secondary | ICD-10-CM | POA: Insufficient documentation

## 2023-11-20 MED ORDER — ATORVASTATIN CALCIUM 40 MG PO TABS
40.0000 mg | ORAL_TABLET | Freq: Every day | ORAL | 0 refills | Status: DC
  Filled 2023-11-20: qty 90, 90d supply, fill #0

## 2023-11-25 ENCOUNTER — Other Ambulatory Visit (HOSPITAL_COMMUNITY): Payer: Self-pay

## 2023-11-27 ENCOUNTER — Other Ambulatory Visit (HOSPITAL_COMMUNITY): Payer: Self-pay

## 2023-11-27 ENCOUNTER — Other Ambulatory Visit: Payer: Self-pay

## 2023-11-27 MED ORDER — METFORMIN HCL 500 MG PO TABS
ORAL_TABLET | ORAL | 1 refills | Status: DC
  Filled 2023-11-27: qty 270, 90d supply, fill #0
  Filled 2024-02-19: qty 270, 90d supply, fill #1

## 2023-11-28 LAB — GENECONNECT MOLECULAR SCREEN: Genetic Analysis Overall Interpretation: NEGATIVE

## 2023-12-06 ENCOUNTER — Other Ambulatory Visit (HOSPITAL_COMMUNITY): Payer: Self-pay

## 2023-12-06 MED ORDER — OZEMPIC (1 MG/DOSE) 4 MG/3ML ~~LOC~~ SOPN
1.0000 mg | PEN_INJECTOR | SUBCUTANEOUS | 3 refills | Status: AC
  Filled 2023-12-06 – 2023-12-12 (×4): qty 9, 84d supply, fill #0
  Filled 2024-03-27: qty 9, 84d supply, fill #1
  Filled 2024-06-17: qty 9, 84d supply, fill #2

## 2023-12-07 ENCOUNTER — Other Ambulatory Visit: Payer: Self-pay

## 2023-12-08 ENCOUNTER — Other Ambulatory Visit: Payer: Self-pay

## 2023-12-12 ENCOUNTER — Other Ambulatory Visit (HOSPITAL_COMMUNITY): Payer: Self-pay

## 2023-12-12 ENCOUNTER — Other Ambulatory Visit: Payer: Self-pay

## 2023-12-21 ENCOUNTER — Other Ambulatory Visit (HOSPITAL_COMMUNITY): Payer: Self-pay

## 2024-01-22 ENCOUNTER — Other Ambulatory Visit (HOSPITAL_COMMUNITY): Payer: Self-pay

## 2024-01-22 MED ORDER — LISINOPRIL 5 MG PO TABS
5.0000 mg | ORAL_TABLET | Freq: Every day | ORAL | 1 refills | Status: DC
  Filled 2024-01-22: qty 90, 90d supply, fill #0
  Filled 2024-04-20: qty 90, 90d supply, fill #1

## 2024-01-26 ENCOUNTER — Other Ambulatory Visit (HOSPITAL_COMMUNITY): Payer: Self-pay

## 2024-01-26 MED ORDER — PEG 3350-KCL-NA BICARB-NACL 420 G PO SOLR
ORAL | 0 refills | Status: AC
  Filled 2024-01-26: qty 4000, 1d supply, fill #0

## 2024-01-26 MED ORDER — BISACODYL 5 MG PO TBEC
DELAYED_RELEASE_TABLET | ORAL | 0 refills | Status: AC
  Filled 2024-01-26: qty 4, 1d supply, fill #0

## 2024-01-26 MED ORDER — PEG 3350-KCL-NA BICARB-NACL 420 G PO SOLR
4000.0000 mL | ORAL | 0 refills | Status: DC
  Filled 2024-01-26: qty 4000, 1d supply, fill #0

## 2024-01-26 MED ORDER — BISACODYL 5 MG PO TBEC
20.0000 mg | DELAYED_RELEASE_TABLET | ORAL | 0 refills | Status: DC
  Filled 2024-01-26: qty 4, 1d supply, fill #0

## 2024-02-12 ENCOUNTER — Other Ambulatory Visit (HOSPITAL_COMMUNITY): Payer: Self-pay

## 2024-02-14 ENCOUNTER — Other Ambulatory Visit (HOSPITAL_COMMUNITY): Payer: Self-pay

## 2024-02-14 ENCOUNTER — Other Ambulatory Visit: Payer: Self-pay

## 2024-02-14 MED ORDER — ATORVASTATIN CALCIUM 40 MG PO TABS
40.0000 mg | ORAL_TABLET | Freq: Every day | ORAL | 1 refills | Status: AC
  Filled 2024-02-14: qty 90, 90d supply, fill #0
  Filled 2024-05-12: qty 90, 90d supply, fill #1

## 2024-02-14 MED ORDER — SERTRALINE HCL 100 MG PO TABS
200.0000 mg | ORAL_TABLET | Freq: Every day | ORAL | 1 refills | Status: AC
  Filled 2024-02-14: qty 180, 90d supply, fill #0
  Filled 2024-05-12: qty 180, 90d supply, fill #1

## 2024-02-29 ENCOUNTER — Other Ambulatory Visit (HOSPITAL_COMMUNITY): Payer: Self-pay

## 2024-03-27 ENCOUNTER — Other Ambulatory Visit (HOSPITAL_COMMUNITY): Payer: Self-pay

## 2024-03-28 ENCOUNTER — Other Ambulatory Visit: Payer: Self-pay

## 2024-05-18 ENCOUNTER — Other Ambulatory Visit (HOSPITAL_COMMUNITY): Payer: Self-pay

## 2024-05-20 ENCOUNTER — Other Ambulatory Visit: Payer: Self-pay

## 2024-05-20 ENCOUNTER — Other Ambulatory Visit (HOSPITAL_COMMUNITY): Payer: Self-pay

## 2024-05-20 MED ORDER — METFORMIN HCL 500 MG PO TABS
500.0000 mg | ORAL_TABLET | ORAL | 0 refills | Status: AC
  Filled 2024-05-20: qty 270, 90d supply, fill #0

## 2024-05-31 ENCOUNTER — Other Ambulatory Visit: Payer: Self-pay

## 2024-05-31 ENCOUNTER — Other Ambulatory Visit (HOSPITAL_COMMUNITY): Payer: Self-pay

## 2024-05-31 MED ORDER — LISINOPRIL 2.5 MG PO TABS
2.5000 mg | ORAL_TABLET | Freq: Every day | ORAL | 3 refills | Status: AC
  Filled 2024-05-31: qty 90, 90d supply, fill #0

## 2024-06-06 ENCOUNTER — Other Ambulatory Visit (HOSPITAL_COMMUNITY): Payer: Self-pay

## 2024-06-06 MED ORDER — FLUZONE HIGH-DOSE 0.5 ML IM SUSY
0.5000 mL | PREFILLED_SYRINGE | Freq: Once | INTRAMUSCULAR | 0 refills | Status: AC
  Filled 2024-06-06: qty 0.5, 1d supply, fill #0

## 2024-06-17 ENCOUNTER — Other Ambulatory Visit (HOSPITAL_COMMUNITY): Payer: Self-pay

## 2024-07-16 ENCOUNTER — Other Ambulatory Visit (HOSPITAL_COMMUNITY): Payer: Self-pay

## 2024-07-16 MED ORDER — LISINOPRIL 2.5 MG PO TABS
2.5000 mg | ORAL_TABLET | Freq: Every day | ORAL | 1 refills | Status: AC
  Filled 2024-07-16: qty 90, 90d supply, fill #0
  Filled ????-??-??: fill #0
